# Patient Record
Sex: Male | Born: 1950 | Race: White | Hispanic: No | Marital: Married | Smoking: Current every day smoker
Health system: Southern US, Community
[De-identification: ages and names within clinical notes are randomized; demographics above are authoritative.]

## PROBLEM LIST (undated history)

## (undated) DIAGNOSIS — K635 Polyp of colon: Secondary | ICD-10-CM

## (undated) DIAGNOSIS — E1141 Type 2 diabetes mellitus with diabetic mononeuropathy: Secondary | ICD-10-CM

## (undated) DIAGNOSIS — M1712 Unilateral primary osteoarthritis, left knee: Secondary | ICD-10-CM

## (undated) DIAGNOSIS — Z720 Tobacco use: Secondary | ICD-10-CM

## (undated) DIAGNOSIS — E785 Hyperlipidemia, unspecified: Secondary | ICD-10-CM

## (undated) DIAGNOSIS — I10 Essential (primary) hypertension: Secondary | ICD-10-CM

## (undated) DIAGNOSIS — J449 Chronic obstructive pulmonary disease, unspecified: Secondary | ICD-10-CM

## (undated) DIAGNOSIS — I2510 Atherosclerotic heart disease of native coronary artery without angina pectoris: Secondary | ICD-10-CM

## (undated) DIAGNOSIS — E119 Type 2 diabetes mellitus without complications: Secondary | ICD-10-CM

## (undated) DIAGNOSIS — M1990 Unspecified osteoarthritis, unspecified site: Secondary | ICD-10-CM

## (undated) DIAGNOSIS — K219 Gastro-esophageal reflux disease without esophagitis: Secondary | ICD-10-CM

## (undated) HISTORY — PX: TONSILLECTOMY: SUR1361

## (undated) HISTORY — PX: CATARACT EXTRACTION W/ INTRAOCULAR LENS  IMPLANT, BILATERAL: SHX1307

## (undated) HISTORY — PX: CORONARY ANGIOPLASTY WITH STENT PLACEMENT: SHX49

---

## 2005-10-23 ENCOUNTER — Ambulatory Visit: Payer: Self-pay | Admitting: Gastroenterology

## 2008-08-14 ENCOUNTER — Ambulatory Visit: Payer: Self-pay | Admitting: Family Medicine

## 2008-12-12 ENCOUNTER — Ambulatory Visit: Payer: Self-pay | Admitting: Unknown Physician Specialty

## 2009-06-04 ENCOUNTER — Ambulatory Visit: Payer: Self-pay | Admitting: Family Medicine

## 2010-03-04 ENCOUNTER — Ambulatory Visit: Payer: Self-pay | Admitting: Family Medicine

## 2010-03-20 ENCOUNTER — Ambulatory Visit: Payer: Self-pay | Admitting: Rheumatology

## 2010-05-08 ENCOUNTER — Ambulatory Visit: Payer: Self-pay | Admitting: Internal Medicine

## 2010-05-12 ENCOUNTER — Ambulatory Visit: Payer: Self-pay | Admitting: Anesthesiology

## 2010-06-10 ENCOUNTER — Ambulatory Visit: Payer: Self-pay | Admitting: Anesthesiology

## 2010-07-10 ENCOUNTER — Ambulatory Visit: Payer: Self-pay | Admitting: Anesthesiology

## 2010-08-12 ENCOUNTER — Ambulatory Visit: Payer: Self-pay | Admitting: Anesthesiology

## 2011-04-29 ENCOUNTER — Ambulatory Visit: Payer: Self-pay | Admitting: Family Medicine

## 2011-04-29 ENCOUNTER — Observation Stay: Payer: Self-pay | Admitting: Specialist

## 2012-01-28 ENCOUNTER — Ambulatory Visit: Payer: Self-pay | Admitting: Family Medicine

## 2014-11-20 ENCOUNTER — Inpatient Hospital Stay: Admission: EM | Admit: 2014-11-20 | Payer: Self-pay | Source: Other Acute Inpatient Hospital | Admitting: Cardiology

## 2014-11-20 ENCOUNTER — Encounter (HOSPITAL_COMMUNITY)
Admission: EM | Disposition: A | Discharge status: Home / Self Care | Payer: Self-pay | Source: Other Acute Inpatient Hospital | Attending: Cardiology

## 2014-11-20 ENCOUNTER — Emergency Department: Payer: Self-pay | Admitting: Internal Medicine

## 2014-11-20 ENCOUNTER — Inpatient Hospital Stay (HOSPITAL_COMMUNITY)
Admission: EM | Admit: 2014-11-20 | Discharge: 2014-11-22 | DRG: 282 | Disposition: A | Discharge status: Home / Self Care | Payer: Medicare Other | Source: Other Acute Inpatient Hospital | Attending: Cardiology | Admitting: Cardiology

## 2014-11-20 ENCOUNTER — Encounter (HOSPITAL_COMMUNITY): Payer: Self-pay | Admitting: Nurse Practitioner

## 2014-11-20 DIAGNOSIS — J449 Chronic obstructive pulmonary disease, unspecified: Secondary | ICD-10-CM | POA: Diagnosis present

## 2014-11-20 DIAGNOSIS — F17210 Nicotine dependence, cigarettes, uncomplicated: Secondary | ICD-10-CM | POA: Diagnosis present

## 2014-11-20 DIAGNOSIS — E1141 Type 2 diabetes mellitus with diabetic mononeuropathy: Secondary | ICD-10-CM | POA: Diagnosis present

## 2014-11-20 DIAGNOSIS — I2129 ST elevation (STEMI) myocardial infarction involving other sites: Secondary | ICD-10-CM | POA: Insufficient documentation

## 2014-11-20 DIAGNOSIS — I10 Essential (primary) hypertension: Secondary | ICD-10-CM | POA: Diagnosis present

## 2014-11-20 DIAGNOSIS — I214 Non-ST elevation (NSTEMI) myocardial infarction: Secondary | ICD-10-CM | POA: Diagnosis present

## 2014-11-20 DIAGNOSIS — E785 Hyperlipidemia, unspecified: Secondary | ICD-10-CM | POA: Diagnosis present

## 2014-11-20 DIAGNOSIS — Z7982 Long term (current) use of aspirin: Secondary | ICD-10-CM | POA: Diagnosis not present

## 2014-11-20 DIAGNOSIS — I2584 Coronary atherosclerosis due to calcified coronary lesion: Secondary | ICD-10-CM | POA: Diagnosis present

## 2014-11-20 DIAGNOSIS — E119 Type 2 diabetes mellitus without complications: Secondary | ICD-10-CM

## 2014-11-20 DIAGNOSIS — I2510 Atherosclerotic heart disease of native coronary artery without angina pectoris: Secondary | ICD-10-CM

## 2014-11-20 DIAGNOSIS — R079 Chest pain, unspecified: Secondary | ICD-10-CM | POA: Diagnosis present

## 2014-11-20 DIAGNOSIS — I213 ST elevation (STEMI) myocardial infarction of unspecified site: Secondary | ICD-10-CM | POA: Insufficient documentation

## 2014-11-20 DIAGNOSIS — Z955 Presence of coronary angioplasty implant and graft: Secondary | ICD-10-CM | POA: Diagnosis not present

## 2014-11-20 DIAGNOSIS — I2109 ST elevation (STEMI) myocardial infarction involving other coronary artery of anterior wall: Secondary | ICD-10-CM

## 2014-11-20 DIAGNOSIS — Z720 Tobacco use: Secondary | ICD-10-CM | POA: Diagnosis present

## 2014-11-20 HISTORY — DX: Unspecified osteoarthritis, unspecified site: M19.90

## 2014-11-20 HISTORY — DX: Hyperlipidemia, unspecified: E78.5

## 2014-11-20 HISTORY — DX: Atherosclerotic heart disease of native coronary artery without angina pectoris: I25.10

## 2014-11-20 HISTORY — DX: Polyp of colon: K63.5

## 2014-11-20 HISTORY — DX: Unilateral primary osteoarthritis, left knee: M17.12

## 2014-11-20 HISTORY — DX: Essential (primary) hypertension: I10

## 2014-11-20 HISTORY — PX: CARDIAC CATHETERIZATION: SHX172

## 2014-11-20 HISTORY — DX: Tobacco use: Z72.0

## 2014-11-20 HISTORY — DX: Gastro-esophageal reflux disease without esophagitis: K21.9

## 2014-11-20 HISTORY — DX: Type 2 diabetes mellitus without complications: E11.9

## 2014-11-20 HISTORY — PX: LEFT HEART CATHETERIZATION WITH CORONARY ANGIOGRAM: SHX5451

## 2014-11-20 HISTORY — DX: Chronic obstructive pulmonary disease, unspecified: J44.9

## 2014-11-20 HISTORY — DX: Type 2 diabetes mellitus with diabetic mononeuropathy: E11.41

## 2014-11-20 LAB — BASIC METABOLIC PANEL
Anion Gap: 6 — ABNORMAL LOW (ref 7–16)
BUN: 8 mg/dL (ref 7–18)
CALCIUM: 8.6 mg/dL (ref 8.5–10.1)
CO2: 30 mmol/L (ref 21–32)
Chloride: 104 mmol/L (ref 98–107)
Creatinine: 0.91 mg/dL (ref 0.60–1.30)
GLUCOSE: 117 mg/dL — AB (ref 65–99)
Osmolality: 279 (ref 275–301)
POTASSIUM: 3.8 mmol/L (ref 3.5–5.1)
Sodium: 140 mmol/L (ref 136–145)

## 2014-11-20 LAB — COMPREHENSIVE METABOLIC PANEL
ALK PHOS: 59 U/L (ref 39–117)
ALT: 23 U/L (ref 0–53)
AST: 30 U/L (ref 0–37)
Albumin: 3.5 g/dL (ref 3.5–5.2)
Anion gap: 11 (ref 5–15)
BUN: 8 mg/dL (ref 6–23)
CHLORIDE: 103 meq/L (ref 96–112)
CO2: 23 mmol/L (ref 19–32)
CREATININE: 1.07 mg/dL (ref 0.50–1.35)
Calcium: 9.1 mg/dL (ref 8.4–10.5)
GFR calc Af Amer: 82 mL/min — ABNORMAL LOW (ref >90)
GFR, EST NON AFRICAN AMERICAN: 71 mL/min — AB (ref >90)
Glucose, Bld: 220 mg/dL — ABNORMAL HIGH (ref 70–99)
Potassium: 3.7 mmol/L (ref 3.5–5.1)
SODIUM: 137 mmol/L (ref 135–145)
TOTAL PROTEIN: 6.9 g/dL (ref 6.0–8.3)
Total Bilirubin: 0.8 mg/dL (ref 0.3–1.2)

## 2014-11-20 LAB — CBC WITH DIFFERENTIAL/PLATELET
BASOS PCT: 0 % (ref 0–1)
Basophils Absolute: 0 10*3/uL (ref 0.0–0.1)
EOS ABS: 0.2 10*3/uL (ref 0.0–0.7)
Eosinophils Relative: 4 % (ref 0–5)
HEMATOCRIT: 37.3 % — AB (ref 39.0–52.0)
HEMOGLOBIN: 12.4 g/dL — AB (ref 13.0–17.0)
LYMPHS ABS: 1.4 10*3/uL (ref 0.7–4.0)
Lymphocytes Relative: 28 % (ref 12–46)
MCH: 28.8 pg (ref 26.0–34.0)
MCHC: 33.2 g/dL (ref 30.0–36.0)
MCV: 86.5 fL (ref 78.0–100.0)
Monocytes Absolute: 0.3 10*3/uL (ref 0.1–1.0)
Monocytes Relative: 5 % (ref 3–12)
Neutro Abs: 3.1 10*3/uL (ref 1.7–7.7)
Neutrophils Relative %: 62 % (ref 43–77)
PLATELETS: 108 10*3/uL — AB (ref 150–400)
RBC: 4.31 MIL/uL (ref 4.22–5.81)
RDW: 14.1 % (ref 11.5–15.5)
WBC: 5 10*3/uL (ref 4.0–10.5)

## 2014-11-20 LAB — CBC
HCT: 40.3 % (ref 40.0–52.0)
HGB: 13.1 g/dL (ref 13.0–18.0)
MCH: 29.1 pg (ref 26.0–34.0)
MCHC: 32.5 g/dL (ref 32.0–36.0)
MCV: 90 fL (ref 80–100)
PLATELETS: 125 10*3/uL — AB (ref 150–440)
RBC: 4.5 10*6/uL (ref 4.40–5.90)
RDW: 14.7 % — AB (ref 11.5–14.5)
WBC: 5.2 10*3/uL (ref 3.8–10.6)

## 2014-11-20 LAB — TROPONIN I
TROPONIN I: 0.12 ng/mL — AB (ref <0.031)
Troponin-I: <0.02 ng/mL

## 2014-11-20 LAB — MAGNESIUM: MAGNESIUM: 1.6 mg/dL (ref 1.5–2.5)

## 2014-11-20 LAB — TSH: TSH: 1.647 u[IU]/mL (ref 0.350–4.500)

## 2014-11-20 LAB — GLUCOSE, CAPILLARY: Glucose-Capillary: 138 mg/dL — ABNORMAL HIGH (ref 70–99)

## 2014-11-20 SURGERY — LEFT HEART CATHETERIZATION WITH CORONARY ANGIOGRAM

## 2014-11-20 MED ORDER — CYCLOBENZAPRINE HCL 10 MG PO TABS
10.0000 mg | ORAL_TABLET | Freq: Every day | ORAL | Status: DC
  Filled 2014-11-20 (×3): qty 1

## 2014-11-20 MED ORDER — HEPARIN SODIUM (PORCINE) 5000 UNIT/ML IJ SOLN: 5000.0000 [IU] | Freq: Three times a day (TID) | INTRAMUSCULAR | Status: DC

## 2014-11-20 MED ORDER — SODIUM CHLORIDE 0.9 % IJ SOLN: 3.0000 mL | INTRAMUSCULAR | Status: DC | PRN

## 2014-11-20 MED ORDER — SODIUM CHLORIDE 0.9 % IJ SOLN
3.0000 mL | Freq: Two times a day (BID) | INTRAMUSCULAR | Status: DC
  Administered 2014-11-21: 10:00:00 3 mL via INTRAVENOUS

## 2014-11-20 MED ORDER — INSULIN ASPART 100 UNIT/ML ~~LOC~~ SOLN
0.0000 [IU] | Freq: Three times a day (TID) | SUBCUTANEOUS | Status: DC
  Administered 2014-11-21: 13:00:00 3 [IU] via SUBCUTANEOUS
  Administered 2014-11-21 – 2014-11-22 (×3): 2 [IU] via SUBCUTANEOUS

## 2014-11-20 MED ORDER — ALBUTEROL SULFATE (2.5 MG/3ML) 0.083% IN NEBU: 2.5000 mg | INHALATION_SOLUTION | RESPIRATORY_TRACT | Status: DC | PRN

## 2014-11-20 MED ORDER — CARVEDILOL 12.5 MG PO TABS
12.5000 mg | ORAL_TABLET | Freq: Two times a day (BID) | ORAL | Status: DC
Start: 2014-11-20 — End: 2014-11-21
  Administered 2014-11-20: 12.5 mg via ORAL
  Filled 2014-11-20 (×3): qty 1

## 2014-11-20 MED ORDER — VERAPAMIL HCL 2.5 MG/ML IV SOLN
INTRAVENOUS | Status: AC
  Filled 2014-11-20: qty 2

## 2014-11-20 MED ORDER — SODIUM CHLORIDE 0.9 % IV SOLN: 250.0000 mL | INTRAVENOUS | Status: DC | PRN

## 2014-11-20 MED ORDER — LINAGLIPTIN 5 MG PO TABS
5.0000 mg | ORAL_TABLET | Freq: Every day | ORAL | Status: DC
  Administered 2014-11-21 – 2014-11-22 (×2): 5 mg via ORAL
  Filled 2014-11-20 (×2): qty 1

## 2014-11-20 MED ORDER — ONDANSETRON HCL 4 MG/2ML IJ SOLN: 4.0000 mg | Freq: Four times a day (QID) | INTRAMUSCULAR | Status: DC | PRN

## 2014-11-20 MED ORDER — HEPARIN SODIUM (PORCINE) 5000 UNIT/ML IJ SOLN
5000.0000 [IU] | Freq: Three times a day (TID) | INTRAMUSCULAR | Status: DC
  Administered 2014-11-21 – 2014-11-22 (×3): 5000 [IU] via SUBCUTANEOUS
  Filled 2014-11-20 (×7): qty 1

## 2014-11-20 MED ORDER — HYDRALAZINE HCL 20 MG/ML IJ SOLN: 10.0000 mg | INTRAMUSCULAR | Status: DC | PRN

## 2014-11-20 MED ORDER — ACETAMINOPHEN 325 MG PO TABS: 650.0000 mg | ORAL_TABLET | ORAL | Status: DC | PRN

## 2014-11-20 MED ORDER — ASPIRIN 81 MG PO CHEW
81.0000 mg | CHEWABLE_TABLET | Freq: Every day | ORAL | Status: DC
  Administered 2014-11-21 – 2014-11-22 (×2): 81 mg via ORAL
  Filled 2014-11-20 (×3): qty 1

## 2014-11-20 MED ORDER — NITROGLYCERIN 0.4 MG SL SUBL: 0.4000 mg | SUBLINGUAL_TABLET | SUBLINGUAL | Status: DC | PRN

## 2014-11-20 MED ORDER — LIDOCAINE HCL (PF) 1 % IJ SOLN
INTRAMUSCULAR | Status: AC
  Filled 2014-11-20: qty 30

## 2014-11-20 MED ORDER — PRAVASTATIN SODIUM 40 MG PO TABS
40.0000 mg | ORAL_TABLET | Freq: Every day | ORAL | Status: DC
  Administered 2014-11-21: 18:00:00 40 mg via ORAL
  Filled 2014-11-20 (×2): qty 1

## 2014-11-20 MED ORDER — TIOTROPIUM BROMIDE MONOHYDRATE 18 MCG IN CAPS
18.0000 ug | ORAL_CAPSULE | Freq: Every day | RESPIRATORY_TRACT | Status: DC
  Administered 2014-11-21 – 2014-11-22 (×2): 18 ug via RESPIRATORY_TRACT
  Filled 2014-11-20 (×2): qty 5

## 2014-11-20 MED ORDER — CARVEDILOL 12.5 MG PO TABS: 12.5000 mg | ORAL_TABLET | Freq: Two times a day (BID) | ORAL | Status: DC

## 2014-11-20 MED ORDER — PANTOPRAZOLE SODIUM 40 MG PO TBEC
40.0000 mg | DELAYED_RELEASE_TABLET | Freq: Every day | ORAL | Status: DC
  Administered 2014-11-21 – 2014-11-22 (×2): 40 mg via ORAL
  Filled 2014-11-20 (×2): qty 1

## 2014-11-20 MED ORDER — SODIUM CHLORIDE 0.9 % IV SOLN
INTRAVENOUS | Status: DC
Start: 2014-11-20 — End: 2014-11-21

## 2014-11-20 MED ORDER — ISOSORBIDE MONONITRATE ER 30 MG PO TB24
30.0000 mg | ORAL_TABLET | Freq: Every day | ORAL | Status: DC
  Administered 2014-11-21: 30 mg via ORAL
  Filled 2014-11-20 (×2): qty 1

## 2014-11-20 MED ORDER — NITROGLYCERIN 1 MG/10 ML FOR IR/CATH LAB
INTRA_ARTERIAL | Status: AC
  Filled 2014-11-20: qty 10

## 2014-11-20 MED ORDER — HEPARIN (PORCINE) IN NACL 2-0.9 UNIT/ML-% IJ SOLN
INTRAMUSCULAR | Status: AC
  Filled 2014-11-20: qty 1000

## 2014-11-20 MED ORDER — AMLODIPINE BESYLATE 10 MG PO TABS
10.0000 mg | ORAL_TABLET | Freq: Every day | ORAL | Status: DC
  Administered 2014-11-21 – 2014-11-22 (×2): 10 mg via ORAL
  Filled 2014-11-20 (×2): qty 1

## 2014-11-20 MED ORDER — ASPIRIN EC 81 MG PO TBEC: 81.0000 mg | DELAYED_RELEASE_TABLET | Freq: Every day | ORAL | Status: DC

## 2014-11-20 MED ORDER — LOSARTAN POTASSIUM 50 MG PO TABS
100.0000 mg | ORAL_TABLET | Freq: Every day | ORAL | Status: DC
  Administered 2014-11-21 – 2014-11-22 (×2): 100 mg via ORAL
  Filled 2014-11-20 (×2): qty 2

## 2014-11-20 NOTE — H&P (Signed)
Patient ID: Tristan Shepherd MRN: [id number], DOB/AGE: [birth year]   Admit date: 11/20/2014   Primary Physician: Maudie Flakesale Feldpausch, MD [facility]) Primary Cardiologist: A. Parachos [facility])  Pt. Profile:  64 y/o male with a h/o CAD s/p prior PCI's @ [facility], who presents on tx today from [facility] 2/2 anterior STEMI.  Problem List  Past Medical History  Diagnosis Date  . CAD (coronary artery disease)     a. 1993 PTCA LCX;  b. 1999 PCI/BMS to RCA and LAD.  Tristan Shepherd. Type II diabetes mellitus   . Essential hypertension   . Hyperlipidemia   . Tobacco abuse   . COPD (chronic obstructive pulmonary disease)   . Colon polyps   . Diabetic mononeuropathy     Past Surgical History  Procedure Laterality Date  . Cataract extraction    . Tonsillectomy       Allergies  Allergies no known allergies  HPI  64 year old male with a prior history of coronary artery disease status post previous PTCA of the left circumflex in 1993 with subsequent bare metal stenting of the LAD and right coronary artery in 1999. He is followed by [place] cardiologist in [place] and believes he has had a catheterization since his last intervention 1999. He was in his usual state of health until this afternoon, when he developed left-sided chest discomfort with radiation to the left forearm at approximately 5:45 PM.  He presented to [facility] in the emergency department was noted to have approximately 2 mm of anterior ST segment elevation in lead V3 with approximately 1 mm of ST elevation in V4. Subsequent ECG showed less pronounced, persistent ST elevation. A code STEMI was called and he was treated with heparin and nitroglycerin. He was transferred to [facility] cardiac catheterization laboratory for further evaluation.  Home Medications (obtained from family medicine note on care everywhere)  Spiriva 18 g once daily Imdur 39 g daily Norvasc 10 mg daily Nitroglycerin  glycerin when necessary Vitamin D3 1000 units daily Flexeril 10 mL daily at bedtime Aspirin 81 mg daily Januvia 50 mg daily Prevacid 30 mg daily Cozaar 25 mA daily Coreg 12.5 g twice a day Metformin 850 mg twice a day Albuterol inhaler 2 puffs every 6 hours when necessary Family History  Unable to obtain at this time secondary to acuity.  Social History  History   Social History  . Marital Status: Married    Spouse Name: N/A    Number of Children: N/A  . Years of Education: N/A   Occupational History  . Not on file.   Social History Main Topics  . Smoking status: Current Every Day Smoker -- 1.50 packs/day for 40 years    Types: Cigarettes  . Smokeless tobacco: Not on file  . Alcohol Use: Not on file  . Drug Use: Not on file  . Sexual Activity: Not on file   Other Topics Concern  . Not on file   Social History Narrative   Lives in [place].     Review of Systems General:  No chills, fever, night sweats or weight changes.  Cardiovascular: Positive chest pain,  no dyspnea on exertion, edema, orthopnea, palpitations, paroxysmal nocturnal dyspnea. Dermatological: No rash, lesions/masses Respiratory: No cough, dyspnea Urologic: No hematuria, dysuria Abdominal:   No nausea, vomiting, diarrhea, bright red blood per rectum, melena, or hematemesis Neurologic:  No visual changes, wkns, changes in mental status. All other systems reviewed and are otherwise negative except as noted  above.  Physical Exam   temperature 98.1, heart rate 86, respirations 11, blood pressure 165/92  General: Pleasant, NAD Psych: Normal affect. Neuro: Alert and oriented X 3. Moves all extremities spontaneously. HEENT: Normal  Neck: Supple without bruits or JVD. Lungs:  Resp regular and unlabored, CTA. Heart: RRR no s3, s4, or murmurs. Abdomen: Soft, non-tender, non-distended, BS + x 4.  Extremities: No clubbing, cyanosis or edema. DP/PT/Radials 2+ and equal bilaterally.  Labs  Pending     Radiology/Studies  No results found.  ECG  regular sinus rhythm, 75, 2 mm ST segment elevation in the 3 with 1 mm elevation in V4.  ASESSMENT AND PLAN   1. Acute anterior ST segment elevation myocardial infarction: Patient presented with chest pain and finding of anterior ST segment elevation in leads V3 and V4. Diagnostic catheterization is ongoing. Plan to resume aspirin  and beta blocker therapy. Not clear why is on statin at home.   2. Hypertension: Blood pressure currently elevated. Follow on home meds and adjust as listed.  3. Hyperlipidemia: He does not appear to be on a statin at home. We will look into this.  4. DM II: Hold metformin. Has slight scale insulin.  5. Tobacco abuse: Cessation counseling required.  Signed, Tristan Ducking, NP 11/20/2014, 6:53 PM   Patient seen and examined and history reviewed. Agree with above findings and plan. 64 yo WM with known history of CAD presents with acute chest pain. Ecg suggests an anterior STEMI with ST elevation predominantly in V3 and V4. Recommend emergent cath with possible PCI. Will need aggressive risk factor modification.   Tristan Shepherd, MDFACC 11/20/2014 7:26 PM

## 2014-11-20 NOTE — CV Procedure (Signed)
    Cardiac Catheterization Procedure Note  Name: Tristan Shepherd MRN: [id number] DOB: 11-27-1950  Procedure: Left Heart Cath, Selective Coronary Angiography, LV angiography  Indication: 64 yo WM with history of CAD. He is s/p PTCA of the LCx in 1993 and stenting of the RCA and LAD in 1999. He present now in transfer [facility] with acute onset of chest pain. Ecg shows ST elevation of 2 mm in leads V3 and V4. These changes were new compared to 2012.   Procedural Details: The right wrist was prepped, draped, and anesthetized with 1% lidocaine. Using the modified Seldinger technique, a 6 French slender sheath was introduced into the right radial artery. 3 mg of verapamil was administered through the sheath, weight-based unfractionated heparin was administered intravenously. Standard Judkins catheters were used for selective coronary angiography and left ventriculography. Catheter exchanges were performed over an exchange length guidewire. There were no immediate procedural complications. A TR band was used for radial hemostasis at the completion of the procedure.  The patient was transferred to the post catheterization recovery area for further monitoring. Contrast 100 cc.   Procedural Findings: Hemodynamics: AO 166/62 mean 105 mm Hg LV 166/11 mm Hg  Coronary angiography: Coronary dominance: right  Left mainstem: Calcified without obstructive disease.  Left anterior descending (LAD): The LAD is calcified proximally. There appears to be a stent in the mid vessel that is widely patent. There is scattered disease in the LAD less than 20-30%. The first diagonal has a high take off and has a 50% stenosis in the mid vessel. The second diagonal has 30% disease  Left circumflex (LCx): The LCx is heavily calcified throughout. There is no obstructive disease.   Right coronary artery (RCA):  The RCA has a long stent in the proximal vessel that is widely patent. In the mid vessel there is  a focal 30-40% lesion. The distal RCA has scattered disease less than 20%. The PDA has calcific plaque of 30-40%. The first RV marginal branch is occluded in the proximal to mid vessel.   Left ventriculography: Left ventricular systolic function is normal, LVEF is estimated at 55-65%, there is no significant mitral regurgitation   Final Conclusions:   1. Single vessel obstructive CAD. The only significant obstruction is in the RV marginal branch.  2. Normal LV function.  Recommendations: The patient is currently pain free. It is possible that the RV marginal branch occlusion could be his acute event but is a small vessel. I would not recommend PCI. Will treat medically and cycle cardiac enzymes and Ecg.. Check Echo in am. Overall anatomy looks favorable.   [name], MDFACC  11/20/2014, 7:14 PM

## 2014-11-21 ENCOUNTER — Encounter (HOSPITAL_COMMUNITY): Payer: Self-pay | Admitting: Cardiology

## 2014-11-21 DIAGNOSIS — I214 Non-ST elevation (NSTEMI) myocardial infarction: Secondary | ICD-10-CM

## 2014-11-21 DIAGNOSIS — I2109 ST elevation (STEMI) myocardial infarction involving other coronary artery of anterior wall: Secondary | ICD-10-CM

## 2014-11-21 DIAGNOSIS — E119 Type 2 diabetes mellitus without complications: Secondary | ICD-10-CM

## 2014-11-21 DIAGNOSIS — E785 Hyperlipidemia, unspecified: Secondary | ICD-10-CM

## 2014-11-21 DIAGNOSIS — Z720 Tobacco use: Secondary | ICD-10-CM

## 2014-11-21 LAB — BASIC METABOLIC PANEL
Anion gap: 12 (ref 5–15)
BUN: 7 mg/dL (ref 6–23)
CALCIUM: 9.2 mg/dL (ref 8.4–10.5)
CHLORIDE: 98 meq/L (ref 96–112)
CO2: 27 mmol/L (ref 19–32)
Creatinine, Ser: 0.9 mg/dL (ref 0.50–1.35)
GFR calc Af Amer: >90 mL/min (ref >90)
GFR calc non Af Amer: 87 mL/min — ABNORMAL LOW (ref >90)
GLUCOSE: 275 mg/dL — AB (ref 70–99)
Potassium: 4.5 mmol/L (ref 3.5–5.1)
SODIUM: 137 mmol/L (ref 135–145)

## 2014-11-21 LAB — GLUCOSE, CAPILLARY
GLUCOSE-CAPILLARY: 125 mg/dL — AB (ref 70–99)
Glucose-Capillary: 168 mg/dL — ABNORMAL HIGH (ref 70–99)
Glucose-Capillary: 137 mg/dL — ABNORMAL HIGH (ref 70–99)
Glucose-Capillary: 176 mg/dL — ABNORMAL HIGH (ref 70–99)

## 2014-11-21 LAB — LIPID PANEL
Cholesterol: 155 mg/dL (ref 0–200)
HDL: 31 mg/dL — AB (ref >39)
LDL Cholesterol: 88 mg/dL (ref 0–99)
Total CHOL/HDL Ratio: 5 RATIO
Triglycerides: 181 mg/dL — ABNORMAL HIGH (ref <150)
VLDL: 36 mg/dL (ref 0–40)

## 2014-11-21 LAB — TROPONIN I
Troponin I: 1.07 ng/mL (ref <0.031)
Troponin I: 3.58 ng/mL (ref <0.031)
Troponin I: 5.01 ng/mL (ref <0.031)

## 2014-11-21 MED ORDER — CARVEDILOL 25 MG PO TABS
25.0000 mg | ORAL_TABLET | Freq: Two times a day (BID) | ORAL | Status: DC
  Administered 2014-11-21: 25 mg via ORAL
  Administered 2014-11-21 (×2): 12.5 mg via ORAL
  Administered 2014-11-22: 25 mg via ORAL
  Filled 2014-11-21 (×3): qty 1
  Filled 2014-11-21: qty 2
  Filled 2014-11-21 (×2): qty 1

## 2014-11-21 NOTE — Progress Notes (Addendum)
Patient Name: Tristan Shepherd Date of Encounter: 11/21/2014     Principal Problem:   ST elevation myocardial infarction (STEMI) involving other coronary artery Active Problems:   DM type 2 (diabetes mellitus, type 2)   HTN (hypertension)   Hyperlipidemia   COPD (chronic obstructive pulmonary disease)   Tobacco abuse   STEMI (ST elevation myocardial infarction)    SUBJECTIVE  No CP or SOB. Feeling well.   CURRENT MEDS . amLODipine  10 mg Oral Daily  . aspirin  81 mg Oral Daily  . carvedilol  12.5 mg Oral BID WC  . cyclobenzaprine  10 mg Oral QHS  . heparin  5,000 Units Subcutaneous 3 times per day  . insulin aspart  0-15 Units Subcutaneous TID WC  . isosorbide mononitrate  30 mg Oral Daily  . linagliptin  5 mg Oral Daily  . losartan  100 mg Oral Daily  . pantoprazole  40 mg Oral Daily  . pravastatin  40 mg Oral q1800  . sodium chloride  3 mL Intravenous Q12H  . tiotropium  18 mcg Inhalation Daily    OBJECTIVE  Filed Vitals:   11/20/14 2100 11/20/14 2347 11/21/14 0300 11/21/14 0337  BP: 169/63 182/77 166/69 161/90  Pulse: 82 84 88 83  Temp:  98.5 F (36.9 C)  98.3 F (36.8 C)  TempSrc:  Oral  Oral  Resp:  16    Weight:    154 lb 5.2 oz (70 kg)  SpO2: 96% 96% 90% 95%    Intake/Output Summary (Last 24 hours) at 11/21/14 0704 Last data filed at 11/21/14 0600  Gross per 24 hour  Intake    600 ml  Output   2000 ml  Net  -1400 ml   Filed Weights   11/21/14 0337  Weight: 154 lb 5.2 oz (70 kg)    PHYSICAL EXAM  General: Pleasant, NAD. Neuro: Alert and oriented X 3. Moves all extremities spontaneously. Psych: Normal affect. HEENT:  Normal  Neck: Supple without bruits or JVD. Lungs:  Diffuse wheezing  Heart: RRR no s3, s4, or murmurs. Abdomen: Soft, non-tender, non-distended, BS + x 4.  Extremities: No clubbing, cyanosis or edema. DP/PT/Radials 2+ and equal bilaterally.  Accessory Clinical Findings  CBC  Recent Labs  11/20/14 2112  WBC 5.0    NEUTROABS 3.1  HGB 12.4*  HCT 37.3*  MCV 86.5  PLT 108*   Basic Metabolic Panel  Recent Labs  11/20/14 2112  NA 137  K 3.7  CL 103  CO2 23  GLUCOSE 220*  BUN 8  CREATININE 1.07  CALCIUM 9.1  MG 1.6   Liver Function Tests  Recent Labs  11/20/14 2112  AST 30  ALT 23  ALKPHOS 59  BILITOT 0.8  PROT 6.9  ALBUMIN 3.5   Cardiac Enzymes  Recent Labs  11/20/14 2112 11/21/14 0126  TROPONINI 0.12* 1.07*   Fasting Lipid Panel  Recent Labs  11/21/14 0126  CHOL 155  HDL 31*  LDLCALC 88  TRIG 161*  CHOLHDL 5.0   Thyroid Function Tests  Recent Labs  11/20/14 2112  TSH 1.647    TELE NSR with some PVCs  Radiology/Studies  No results found.  ASSESSMENT AND PLAN  64 yo WM with history of CAD s/p PTCA of the LCx in 1993 and stenting of the RCA and LAD in 1999, DM, HTN, HLD, tobacco abuse, and COPD who presented in transfer from [facility] hospital yesterday with acute onset of chest pain. Ecg revealed ST  elevation of 2 mm in leads V3 and V4. These changes were new compared to 2012 and he was taken for emergent coronary angiography.   NSTEMI: Patient presented with chest pain and finding of anterior ST segment elevation in leads V3 and V4.  -- s/p emergent LHC which revealed    1. Single vessel obstructive CAD. The only significant obstruction is in the RV marginal branch.    2. Normal LV function. -- Recommendations: The patient is currently pain free. It is possible that the RV marginal branch occlusion could be his acute event but is a small vessel. Dr. SwazilandJordan did not recommend PCI. Planned to treat medically. Will add a statin, continue BB, ASA and imdur -- Troponin 0.12--> 1.07. Continue to follow enzymes.  -- 2D ECHO today  Hypertension: Blood pressure currently elevated. He is currently on amlodipine 10mg , coreg 12.5mg  BID, and losartan 100mg  . HR in 80s, will increase his coreg to 25mg  BID  Hyperlipidemia:  Started on a statin   DM II: Hold  metformin 48 hours after contrast dye exposure. Cont SSI  Tobacco abuse: Cessation counseling provided. He is willing to "cut back".    Tristan FischerSigned, THOMPSON, KATHRYN R PA-C  Pager [phone]  I have personally seen and examined this patient with Carlean JewsKatie Thompson, PA-C. I agree with the assessment and plan as outlined above. Admitted with NSTEMI. Cardiac cath per Dr. SwazilandJordan but major vessels are patent. Small RV marginal branch is occluded. NO PCI performed. Medical management of CAD. Echo today. Ambulate. Probable discharge in am 11/22/14.   Tristan Shepherd 11/21/2014 2:47 PM

## 2014-11-21 NOTE — Progress Notes (Signed)
UR completed Domitila Stetler K. Kile Kabler, RN, BSN, MSHL, CCM  11/21/2014 12:42 PM

## 2014-11-21 NOTE — Progress Notes (Signed)
  Echocardiogram 2D Echocardiogram has been performed.  Pearce Littlefield FRANCES 11/21/2014, 11:29 AM

## 2014-11-21 NOTE — Progress Notes (Signed)
TR BAND REMOVAL  LOCATION:  right radial  DEFLATED PER PROTOCOL:  Yes.    TIME BAND OFF / DRESSING APPLIED:   2145   SITE UPON ARRIVAL:   Level 0  SITE AFTER BAND REMOVAL:  Level 0  REVERSE ALLEN'S TEST:    positive  CIRCULATION SENSATION AND MOVEMENT:  Within Normal Limits  Yes.    COMMENTS:    

## 2014-11-21 NOTE — Progress Notes (Signed)
CARDIAC REHAB PHASE I   PRE:  Rate/Rhythm: 80 SR    BP: sitting 184/90    SaO2:   MODE:  Ambulation: 460 ft   POST:  Rate/Rhythm: 95 SR    BP: sitting 178/88     SaO2:   Tolerated well without CP. Feels good and wants to go home. BP elevated (took manually). Discussed MI and restrictions with pt. Pt sts he wants to live his life happy and with quality, he is not concerned with quantity. Discussed the dangers of smoking and pt sts he would like to cut back but is not interested in quitting. Gave resources. Also gave diet sheets but pt did not want to discuss diet changes. Pt sts he is active and does not want to incorporate more formal ex in his lifestyle. Pt was receptive to NTG (keeping fresh). Gave brochure for CRPII but pt not interested at this time.  Encouraged pt to walk today which he is happy to do.  [phone]  Elissa LovettReeve, Elycia Woodside ScribnerKristan CES, ACSM 11/21/2014 9:13 AM

## 2014-11-22 ENCOUNTER — Encounter (HOSPITAL_COMMUNITY): Payer: Self-pay | Admitting: Nurse Practitioner

## 2014-11-22 DIAGNOSIS — I2510 Atherosclerotic heart disease of native coronary artery without angina pectoris: Secondary | ICD-10-CM

## 2014-11-22 LAB — GLUCOSE, CAPILLARY: GLUCOSE-CAPILLARY: 149 mg/dL — AB (ref 70–99)

## 2014-11-22 MED ORDER — ISOSORBIDE MONONITRATE ER 60 MG PO TB24: 60.0000 mg | ORAL_TABLET | Freq: Every day | ORAL | Status: DC

## 2014-11-22 MED ORDER — ISOSORBIDE MONONITRATE ER 60 MG PO TB24
60.0000 mg | ORAL_TABLET | Freq: Every day | ORAL | Status: DC
  Administered 2014-11-22: 12:00:00 60 mg via ORAL
  Filled 2014-11-22 (×2): qty 1

## 2014-11-22 MED ORDER — PRAVASTATIN SODIUM 40 MG PO TABS: 40.0000 mg | ORAL_TABLET | Freq: Every day | ORAL | Status: AC

## 2014-11-22 MED ORDER — LOSARTAN POTASSIUM 100 MG PO TABS: 100.0000 mg | ORAL_TABLET | Freq: Every day | ORAL | Status: AC

## 2014-11-22 MED ORDER — CLOPIDOGREL BISULFATE 75 MG PO TABS: 75.0000 mg | ORAL_TABLET | Freq: Every day | ORAL | Status: AC

## 2014-11-22 MED ORDER — CARVEDILOL 25 MG PO TABS: 25.0000 mg | ORAL_TABLET | Freq: Two times a day (BID) | ORAL | Status: AC

## 2014-11-22 MED ORDER — METFORMIN HCL 850 MG PO TABS: 850.0000 mg | ORAL_TABLET | Freq: Two times a day (BID) | ORAL | Status: DC

## 2014-11-22 MED ORDER — CLOPIDOGREL BISULFATE 75 MG PO TABS
75.0000 mg | ORAL_TABLET | Freq: Every day | ORAL | Status: DC
  Administered 2014-11-22 (×2): 75 mg via ORAL
  Filled 2014-11-22 (×2): qty 1

## 2014-11-22 NOTE — Progress Notes (Signed)
Patient Name: Tristan Shepherd Date of Encounter: 11/22/2014   Principal Problem:   NSTEMI (non-ST elevated myocardial infarction) Active Problems:   CAD (coronary artery disease)   DM type 2 (diabetes mellitus, type 2)   HTN (hypertension)   COPD (chronic obstructive pulmonary disease)   Tobacco abuse   Hyperlipidemia   SUBJECTIVE  No chest pain or sob.  Ambulating w/o difficulty.  Eager to go home.  CURRENT MEDS . amLODipine  10 mg Oral Daily  . aspirin  81 mg Oral Daily  . carvedilol  25 mg Oral BID WC  . cyclobenzaprine  10 mg Oral QHS  . heparin  5,000 Units Subcutaneous 3 times per day  . insulin aspart  0-15 Units Subcutaneous TID WC  . isosorbide mononitrate  30 mg Oral Daily  . linagliptin  5 mg Oral Daily  . losartan  100 mg Oral Daily  . pantoprazole  40 mg Oral Daily  . pravastatin  40 mg Oral q1800  . sodium chloride  3 mL Intravenous Q12H  . tiotropium  18 mcg Inhalation Daily    OBJECTIVE  Filed Vitals:   11/21/14 2343 11/22/14 0441 11/22/14 0800 11/22/14 0840  BP: 155/79 168/83 143/65   Pulse: 84 88 81   Temp: 98.6 F (37 C) 98.2 F (36.8 C) 97.9 F (36.6 C)   TempSrc: Oral Oral Oral   Resp: Height:      Weight:    149 lb 7.6 oz (67.8 kg)  SpO2:   95%     Intake/Output Summary (Last 24 hours) at 11/22/14 1016 Last data filed at 11/22/14 1610  Gross per 24 hour  Intake    640 ml  Output      0 ml  Net    640 ml   Filed Weights   11/21/14 0337 11/22/14 0840  Weight: 154 lb 5.2 oz (70 kg) 149 lb 7.6 oz (67.8 kg)    PHYSICAL EXAM  General: Pleasant, NAD. Neuro: Alert and oriented X 3. Moves all extremities spontaneously. Psych: Normal affect. HEENT:  Normal  Neck: Supple without bruits or JVD. Lungs:  Resp regular and unlabored, CTA. Heart: RRR no s3, s4, or murmurs. Abdomen: Soft, non-tender, non-distended, BS + x 4.  Extremities: No clubbing, cyanosis or edema. DP/PT/Radials 2+ and equal bilaterally.  R wrist w/o  bleeding/bruit/hematoma.  Accessory Clinical Findings  CBC  Recent Labs  11/20/14 2112  WBC 5.0  NEUTROABS 3.1  HGB 12.4*  HCT 37.3*  MCV 86.5  PLT 108*   Basic Metabolic Panel  Recent Labs  11/20/14 2112 11/21/14 0820  NA 137 137  K 3.7 4.5  CL 103 98  CO2 23 27  GLUCOSE 220* 275*  BUN 8 7  CREATININE 1.07 0.90  CALCIUM 9.1 9.2  MG 1.6  --    Liver Function Tests  Recent Labs  11/20/14 2112  AST 30  ALT 23  ALKPHOS 59  BILITOT 0.8  PROT 6.9  ALBUMIN 3.5   Cardiac Enzymes  Recent Labs  11/21/14 0820 11/21/14 1331 11/21/14 1857  TROPONINI 3.58* 5.01* <0.03   Fasting Lipid Panel  Recent Labs  11/21/14 0126  CHOL 155  HDL 31*  LDLCALC 88  TRIG 960*  CHOLHDL 5.0   Thyroid Function Tests  Recent Labs  11/20/14 2112  TSH 1.647    TELE  rsr  ECG  Rsr, 72, LVH.  Radiology/Studies  No results found.  ASSESSMENT AND PLAN  1.  NSTEMI/CAD:  S/p emergent cath in setting of intermittent ST elevation in V3-4.  Cath showed calcified dzs with occluded RV branch.  Med Rx.  No further chest pain.  Ambulating w/o difficulty and f/u in [place] with A. Parachos.  Plan for d/c today on asa, bb, arb, nitrate, statin.  Add plavix in setting of ACS.  2.  HTN:  BP trends in 140's - 150's.  He's on max dose coreg, amlodipine, and losartan.  Titrate imdur to 60.  Will need outpt f/u and potentially hydralazine.  3.  HL:  LDL 88.  Cont high potency statin.  4.  Tob Abuse/COPD:  No active wheezing. Cessation advised.  He is not interested in quitting.  5.  DM II:  Sugars running in 200's.  Cont [place]ajanuvia.  Resume metformin on d/c.  Signed, Nicolasa Duckinghristopher Berge NP   History and all data above reviewed.  Patient examined.  I agree with the findings as above.  No chest pain.  No SOB.The patient exam reveals COR:RRR  ,  Lungs: Clear  ,  Abd: Positive bowel sounds, no rebound no guarding, Ext Right wrist OK  .  All available labs, radiology testing,  previous records reviewed. Agree with documented assessment and plan. CAD:  Medical management.  No further work up.  Meds and plans as listed.    Tristan Shepherd  11:14 AM  11/22/2014

## 2014-11-22 NOTE — Discharge Instructions (Signed)
**  PLEASE REMEMBER TO BRING ALL OF YOUR MEDICATIONS TO EACH OF YOUR FOLLOW-UP OFFICE VISITS. ° °NO HEAVY LIFTING X 4 WEEKS. °NO SEXUAL ACTIVITY X 4 WEEKS. °NO DRIVING X 2 WEEKS. °NO SOAKING BATHS, HOT TUBS, POOLS, ETC., X 7 DAYS. ° °Radial Site Care °Refer to this sheet in the next few weeks. These instructions provide you with information on caring for yourself after your procedure. Your caregiver may also give you more specific instructions. Your treatment has been planned according to current medical practices, but problems sometimes occur. Call your caregiver if you have any problems or questions after your procedure. °HOME CARE INSTRUCTIONS °· You may shower the day after the procedure. Remove the bandage (dressing) and gently wash the site with plain soap and water. Gently pat the site dry.  °· Do not apply powder or lotion to the site.  °· Do not submerge the affected site in water for 3 to 5 days.  °· Inspect the site at least twice daily.  °· Do not flex or bend the affected arm for 24 hours.  °· No lifting over 5 pounds (2.3 kg) for 5 days after your procedure.  °· Do not drive home if you are discharged the same day of the procedure. Have someone else drive you.  ° °What to expect: °· Any bruising will usually fade within 1 to 2 weeks.  °· Blood that collects in the tissue (hematoma) may be painful to the touch. It should usually decrease in size and tenderness within 1 to 2 weeks.  °SEEK IMMEDIATE MEDICAL CARE IF: °· You have unusual pain at the radial site.  °· You have redness, warmth, swelling, or pain at the radial site.  °· You have drainage (other than a small amount of blood on the dressing).  °· You have chills.  °· You have a fever or persistent symptoms for more than 72 hours.  °· You have a fever and your symptoms suddenly get worse.  °· Your arm becomes pale, cool, tingly, or numb.  °· You have heavy bleeding from the site. Hold pressure on the site.  ° °

## 2014-11-22 NOTE — Progress Notes (Signed)
Pt up ambulating without problems. Very anxious, wants to go home "I've been here long enough". Pt admits that part of his anxiety is wanting a cigarette. Pt declines nicotine patch. Pt without questions regarding his heart or risk factors.  Ethelda ChickKristan Bensyn Bornemann CES, ACSM [phone] 10:15 AM 11/22/2014

## 2014-11-22 NOTE — Discharge Summary (Signed)
Discharge Summary   Patient ID: VON QUINTANAR,  MRN: [id number], DOB/AGE: 66-Sep-1952 66 y.o.  Admit date: 11/20/2014 Discharge date: 11/22/2014  Primary Care Provider: [facility] Primary Cardiologist: A. Paraschos, MD  Discharge Diagnoses Principal Problem:   NSTEMI (non-ST elevated myocardial infarction)  **S/P Catheterization this admission revealing an occluded RV branch  Med Rx.  Active Problems:   CAD (coronary artery disease)   DM type 2 (diabetes mellitus, type 2)   HTN (hypertension)   COPD (chronic obstructive pulmonary disease)   Tobacco abuse   Hyperlipidemia   Allergies No Known Allergies  Procedures  Cardiac Catheterization 1.19.2016  Procedural Findings: Hemodynamics: AO 166/62 mean 105 mm Hg LV 166/11 mm Hg  Coronary angiography: Coronary dominance: right  Left mainstem: Calcified without obstructive disease.  Left anterior descending (LAD): The LAD is calcified proximally. There appears to be a stent in the mid vessel that is widely patent. There is scattered disease in the LAD less than 20-30%. The first diagonal has a high take off and has a 50% stenosis in the mid vessel. The second diagonal has 30% disease  Left circumflex (LCx): The LCx is heavily calcified throughout. There is no obstructive disease.    Right coronary artery (RCA):  The RCA has a long stent in the proximal vessel that is widely patent. In the mid vessel there is a focal 30-40% lesion. The distal RCA has scattered disease less than 20%. The PDA has calcific plaque of 30-40%. The first RV marginal branch is occluded in the proximal to mid vessel.    Left ventriculography: Left ventricular systolic function is normal, LVEF is estimated at 55-65%, there is no significant mitral regurgitation   Final Conclusions:   1. Single vessel obstructive CAD. The only significant obstruction is in the RV marginal branch.   2. Normal LV function.  Recommendations: The patient is  currently pain free. It is possible that the RV marginal branch occlusion could be his acute event but is a small vessel. I would not recommend PCI. Will treat medically and cycle cardiac enzymes and Ecg.. Check Echo in am. Overall anatomy looks favorable.  _____________   2D Echocardiogram 1.20.2016  Study Conclusions  - Left ventricle: The cavity size was normal. Wall thickness was   normal. The estimated ejection fraction was 55%. Basal   inferolateral and basal anterolateral hypokinesis. Doppler   parameters are consistent with abnormal left ventricular   relaxation (grade 1 diastolic dysfunction). - Aortic valve: There was no stenosis. - Mitral valve: There was no regurgitation. - Right ventricle: The cavity size was normal. Systolic function   was normal. - Pulmonary arteries: No complete TR doppler jet so unable to   estimate PA systolic pressure. - Inferior vena cava: The vessel was normal in size. The   respirophasic diameter changes were in the normal range (>= 50%),   consistent with normal central venous pressure. _____________   History of Present Illness  64 year old male with a prior history of coronary artery disease status post PTCA of the left circumflex in 1990s 3 with subsequent bare metal stenting of the LAD and right coronary artery in 1999. He is followed by cardiology in [place]. He was in his usual state of health until the early evening of January 19, when he developed severe substernal chest discomfort with radiation to his left arm. He presented to [facility] where he was noted to have 2 mm ST segment elevation in lead V3 with 1 mm  elevation in V4. Patient was treated with heparin and nitroglycerin and a code STEMI was activated. He was [facility] for further evaluation.  Hospital Course  Patient underwent emergent diagnostic cardiac catheterization as he continued to have chest pain. Diagnostic catheterization revealed  relatively nonobstructive coronary artery disease with a patent stent in the LAD. The RV marginal branch of the right coronary artery however, was occluded in the proximal to mid section. The RV marginal occlusion was the only area identified to potentially explain his symptoms. It was not felt to be amenable to PCI and therefore medical therapy was recommended. Following catheterization, Mr. Dayton ScrapeMurray was admitted to stepdown where he ruled in, eventually peaking his troponin at 5.01. He was hypertensive throughout his hospitalization and we have titrated his carvedilol and losartan to 25 mg twice a day and 100 mg daily, respectively. In the setting of acute coronary syndrome, we have added clopidogrel therapy and have also titrated his long-acting nitrate therapy. He has had no further chest discomfort and has been ambulating without difficulty. He has been seen by cardiac rehabilitation and outpatient referral will be made. He has been counseled on the importance of smoking cessation though he says he is not interested in quitting. He will be discharged home today in good condition.  Discharge Vitals Blood pressure 143/65, pulse 81, temperature 97.9 F (36.6 C), temperature source Oral, resp. rate 18, height 5\' 7"  (1.702 m), weight 149 lb 7.6 oz (67.8 kg), SpO2 95 %.  Filed Weights   11/21/14 0337 11/22/14 0840  Weight: 154 lb 5.2 oz (70 kg) 149 lb 7.6 oz (67.8 kg)    Labs  CBC  Recent Labs  11/20/14 2112  WBC 5.0  NEUTROABS 3.1  HGB 12.4*  HCT 37.3*  MCV 86.5  PLT 108*   Basic Metabolic Panel  Recent Labs  11/20/14 2112 11/21/14 0820  NA 137 137  K 3.7 4.5  CL 103 98  CO2 23 27  GLUCOSE 220* 275*  BUN 8 7  CREATININE 1.07 0.90  CALCIUM 9.1 9.2  MG 1.6  --    Liver Function Tests  Recent Labs  11/20/14 2112  AST 30  ALT 23  ALKPHOS 59  BILITOT 0.8  PROT 6.9  ALBUMIN 3.5   Cardiac Enzymes  Recent Labs  11/21/14 0820 11/21/14 1331 11/21/14 1857  TROPONINI  3.58* 5.01* <0.03   Fasting Lipid Panel  Recent Labs  11/21/14 0126  CHOL 155  HDL 31*  LDLCALC 88  TRIG [id number]*  CHOLHDL 5.0   Thyroid Function Tests  Recent Labs  11/20/14 2112  TSH 1.647    Disposition  Pt is being discharged home today in good condition.  Follow-up Plans & Appointments  Follow-up Information    [facility]   Specialty:  Family Medicine   Why:  as scheduled.   Contact information:   [address] [facility] [place] [id number] [phone]       Follow up with Marcina MillardPARASCHOS,ALEXANDER, MD.   Specialty:  Cardiology   Why:  1 week.   Contact information:   [address] [place] [id number] [phone]       Discharge Medications    Medication List    STOP taking these medications        naproxen sodium 220 MG tablet  Commonly known as:  ANAPROX      TAKE these medications  albuterol 108 (90 BASE) MCG/ACT inhaler  Commonly known as:  PROVENTIL HFA;VENTOLIN HFA  Inhale 3 puffs into the lungs every 6 (six) hours as needed for wheezing or shortness of breath.     amLODipine 10 MG tablet  Commonly known as:  NORVASC  10 mg daily.     aspirin EC 81 MG tablet  Take 81 mg by mouth daily.     carvedilol 25 MG tablet  Commonly known as:  COREG  Take 1 tablet (25 mg total) by mouth 2 (two) times daily with a meal.     clopidogrel 75 MG tablet  Commonly known as:  PLAVIX  Take 1 tablet (75 mg total) by mouth daily.     isosorbide mononitrate 60 MG 24 hr tablet  Commonly known as:  IMDUR  Take 1 tablet (60 mg total) by mouth daily.     lansoprazole 30 MG capsule  Commonly known as:  PREVACID  Take 30 mg by mouth daily.     losartan 100 MG tablet  Commonly known as:  COZAAR  Take 1 tablet (100 mg total) by mouth daily.     metFORMIN 850 MG tablet  Commonly known as:  GLUCOPHAGE  Take 1 tablet (850 mg total) by mouth 2 (two) times daily with a meal.      nitroGLYCERIN 0.4 MG SL tablet  Commonly known as:  NITROSTAT  Place 0.4 mg under the tongue every 5 (five) minutes as needed for chest pain.     pravastatin 40 MG tablet  Commonly known as:  PRAVACHOL  Take 1 tablet (40 mg total) by mouth daily at 6 PM.     sitaGLIPtin 50 MG tablet  Commonly known as:  JANUVIA  Take 50 mg by mouth daily.     tiotropium 18 MCG inhalation capsule  Commonly known as:  SPIRIVA  Place 18 mcg into inhaler and inhale daily.     Vitamin D3 1000 UNITS Caps  Take 1,000 Units by mouth every evening.       Outstanding Labs/Studies  None  Duration of Discharge Encounter   Greater than 30 minutes including physician time.  Signed, Nicolasa Ducking NP 11/22/2014, 10:41 AM  Patient seen and examined.  Plan as discussed in my rounding note for today and outlined above. Fayrene Fearing Isaiah Torok  11/22/2014  11:10 AM

## 2014-12-05 ENCOUNTER — Telehealth: Payer: Self-pay | Admitting: Cardiology

## 2014-12-06 NOTE — Telephone Encounter (Signed)
Close encounter 

## 2014-12-21 ENCOUNTER — Ambulatory Visit (INDEPENDENT_AMBULATORY_CARE_PROVIDER_SITE_OTHER): Payer: Medicare Other | Admitting: Physician Assistant

## 2014-12-21 ENCOUNTER — Encounter: Payer: Self-pay | Admitting: Physician Assistant

## 2014-12-21 VITALS — BP 164/72 | HR 78 | Ht 67.0 in | Wt 158.4 lb

## 2014-12-21 DIAGNOSIS — Z720 Tobacco use: Secondary | ICD-10-CM

## 2014-12-21 DIAGNOSIS — I209 Angina pectoris, unspecified: Secondary | ICD-10-CM

## 2014-12-21 DIAGNOSIS — E785 Hyperlipidemia, unspecified: Secondary | ICD-10-CM

## 2014-12-21 DIAGNOSIS — I10 Essential (primary) hypertension: Secondary | ICD-10-CM

## 2014-12-21 DIAGNOSIS — I2583 Coronary atherosclerosis due to lipid rich plaque: Secondary | ICD-10-CM

## 2014-12-21 DIAGNOSIS — I739 Peripheral vascular disease, unspecified: Secondary | ICD-10-CM

## 2014-12-21 DIAGNOSIS — I2510 Atherosclerotic heart disease of native coronary artery without angina pectoris: Secondary | ICD-10-CM

## 2014-12-21 DIAGNOSIS — R079 Chest pain, unspecified: Secondary | ICD-10-CM

## 2014-12-21 MED ORDER — HYDRALAZINE HCL 25 MG PO TABS: 25.0000 mg | ORAL_TABLET | Freq: Two times a day (BID) | ORAL | Status: DC

## 2014-12-21 MED ORDER — ISOSORBIDE MONONITRATE ER 60 MG PO TB24: 90.0000 mg | ORAL_TABLET | Freq: Every day | ORAL | Status: AC

## 2014-12-21 NOTE — Assessment & Plan Note (Signed)
Continue statin. 

## 2014-12-21 NOTE — Assessment & Plan Note (Signed)
He has cut back some however he is not interested in quitting.

## 2014-12-21 NOTE — Patient Instructions (Addendum)
Get some over the counter magnesium powder and take every other day as directed on the bottle and see if that helps muscle cramping.  INCREASE Isosorbide to 90mg  daily ( 1 1/2 tablets daily).  START Hydralazine 25mg  twice a day.  Your physician has requested that you have an ankle brachial index (ABI). During this test an ultrasound and blood pressure cuff are used to evaluate the arteries that supply the arms and legs with blood. Allow thirty minutes for this exam. There are no restrictions or special instructions.  Your physician recommends that you schedule a follow-up appointment in: 3 months with Dr. Peter [place].

## 2014-12-21 NOTE — Assessment & Plan Note (Signed)
Increase Imdur to 90mg daily

## 2014-12-21 NOTE — Progress Notes (Signed)
Patient ID: Tristan Shepherd, male   DOB: Jul 15, 1951, 64 y.o.   MRN: [id number]    Date:  12/21/2014   ID:  Tristan Shepherd, DOB [birth year], MRN [id number]  PCP:  Marina Goodell, MD  Primary Cardiologist:jordan    Chief Complaint  Patient presents with  . Follow-up    Post Hospital:  [place]. chest discomfort - took 1 NTG this AM (his dog upset him).  No SOB or dizziness.  Left ankle and foot stays swollen all the time.     History of Present Illness: Tristan Shepherd is a 64 y.o. male 64 year old male with a prior history of coronary artery disease status post PTCA of the left circumflex in 1990s with subsequent bare metal stenting of the LAD and right coronary artery in 1999. He was followed by cardiology in [place] previously but due to insurance changes he will come here. He was in his usual state of health until the early evening of January 19, when he developed severe substernal chest discomfort with radiation to his left arm. He presented to [facility] where he was noted to have 2 mm ST segment elevation in lead V3 with 1 mm elevation in V4. Patient was treated with heparin and nitroglycerin and a code STEMI was activated. He was transferred to [facility] for further evaluation.  Patient underwent emergent diagnostic cardiac catheterization as he continued to have chest pain. Diagnostic catheterization revealed relatively nonobstructive coronary artery disease with a patent stent in the LAD. The RV marginal branch of the right coronary artery however, was occluded in the proximal to mid section. The RV marginal occlusion was the only area identified to potentially explain his symptoms. It was not felt to be amenable to PCI and therefore medical therapy was recommended. Following catheterization, [name] was admitted to stepdown where he ruled in, eventually peaking his troponin at 5.01. He was hypertensive throughout his hospitalization and we have titrated his  carvedilol and losartan to 25 mg twice a day and 100 mg daily, respectively. In the setting of acute coronary syndrome, we have added clopidogrel therapy and have also titrated his long-acting nitrate therapy. He has had no further chest discomfort and ambulated without difficulty. He was seen by cardiac rehabilitation and outpatient referral will be made. He has been counseled on the importance of smoking cessation though he says he is not interested in quitting.   He presents today for posthospital follow-up. Ports an episode of chest pain after getting upset with his dog. He did use a sublingual nitroglycerin with relief. Also reports intermittent cramping all over his body which is been ongoing for over a year. He can go a week without any cramps none have couple days worth. He also gets pain in the lower extremities with ambulation of about 100 yards. This resolves with a couple minutes of rest and then he can continue on. He denies shortness of breath beyond baseline.   The patient currently denies nausea, vomiting, fever, orthopnea, dizziness, PND, cough, congestion, abdominal pain, hematochezia, melena, lower extremity edema.  Wt Readings from Last 3 Encounters:  12/21/14 158 lb 6.4 oz (71.85 kg)  11/22/14 149 lb 7.6 oz (67.8 kg)     Past Medical History  Diagnosis Date  . CAD (coronary artery disease)     a. 1993 PTCA LCX;  b. 1999 PCI/BMS to RCA and LAD;  c. 11/2014 LM Ca2+, LAD patent mid stent, 20-53m, D1 25m, D2 30, LCX Ca2+, RCA 30-14m,  20d, PDA 30-40, RV 100p/m, EF 55-65%;  d. 11/2014 Echo: EF 55%, Gr 1 DD.  [name]. Essential hypertension   . Hyperlipidemia   . Tobacco abuse   . Colon polyps   . Type II diabetes mellitus   . Diabetic mononeuropathy   . COPD (chronic obstructive pulmonary disease)     "beginning stages" (11/20/2014)  . GERD (gastroesophageal reflux disease)   . Arthritis     "touch; fingers; knees" (11/20/2014)  . Degenerative arthritis of left knee     Current  Outpatient Prescriptions  Medication Sig Dispense Refill  . albuterol (PROVENTIL HFA;VENTOLIN HFA) 108 (90 BASE) MCG/ACT inhaler Inhale 3 puffs into the lungs every 6 (six) hours as needed for wheezing or shortness of breath.    [name]. amLODipine (NORVASC) 10 MG tablet 10 mg daily.     [name]. aspirin EC 81 MG tablet Take 81 mg by mouth daily.     . carvedilol (COREG) 25 MG tablet Take 1 tablet (25 mg total) by mouth 2 (two) times daily with a meal. 60 tablet 6  . Cholecalciferol (VITAMIN D3) 1000 UNITS CAPS Take 1,000 Units by mouth every evening.     . clopidogrel (PLAVIX) 75 MG tablet Take 1 tablet (75 mg total) by mouth daily. 30 tablet 6  . hydrALAZINE (APRESOLINE) 25 MG tablet Take 1 tablet (25 mg total) by mouth 2 (two) times daily. 60 tablet 6  . isosorbide mononitrate (IMDUR) 60 MG 24 hr tablet Take 1.5 tablets (90 mg total) by mouth daily. 45 tablet 6  . lansoprazole (PREVACID) 30 MG capsule Take 30 mg by mouth daily.     [name]. losartan (COZAAR) 100 MG tablet Take 1 tablet (100 mg total) by mouth daily. 30 tablet 6  . metFORMIN (GLUCOPHAGE) 850 MG tablet Take 1 tablet (850 mg total) by mouth 2 (two) times daily with a meal.    . nitroGLYCERIN (NITROSTAT) 0.4 MG SL tablet Place 0.4 mg under the tongue every 5 (five) minutes as needed for chest pain.     . pravastatin (PRAVACHOL) 40 MG tablet Take 1 tablet (40 mg total) by mouth daily at 6 PM. 30 tablet 6  . sitaGLIPtin (JANUVIA) 50 MG tablet Take 50 mg by mouth daily.     [name]. tiotropium (SPIRIVA) 18 MCG inhalation capsule Place 18 mcg into inhaler and inhale daily.      No current facility-administered medications for this visit.    Allergies:   No Known Allergies  Social History:  The patient  reports that he has been smoking Cigarettes.  He has a 75 pack-year smoking history. He has never used smokeless tobacco. He reports that he drinks about 21.0 oz of alcohol per week. He reports that he does not use illicit drugs.   Family history:  No family  history on file.  ROS:  Please see the history of present illness.  All other systems reviewed and negative.   PHYSICAL EXAM: VS:  BP 164/72 mmHg  Pulse 78  Ht 5\' 7"  (1.702 m)  Wt 158 lb 6.4 oz (71.85 kg)  BMI 24.80 kg/m2 Well nourished, well developed, in no acute distress HEENT: Pupils are equal round react to light accommodation extraocular movements are intact.  Neck: no JVDNo cervical lymphadenopathy. Cardiac: Regular rate and rhythm without murmurs rubs or gallops. Lungs:  clear to auscultation bilaterally, no wheezing, rhonchi or rales Abd: soft, nontender, positive bowel sounds all quadrants, no hepatosplenomegaly Ext: no lower extremity edema.  2+ radial and severely  diminished pedal pulses Skin: warm and dry Neuro:  Grossly normal  EKG:  Normal sinus rhythm rate 78 bpm T-wave inversion V1 through 3  ASSESSMENT AND PLAN:  Problem List Items Addressed This Visit    Tobacco abuse    He has cut back some however he is not interested in quitting.      Hyperlipidemia    Continue statin      Relevant Medications   hydrALAZINE (APRESOLINE) tablet   isosorbide mononitrate (IMDUR) 24 hr tablet   HTN (hypertension)    Blood pressure is not well-controlled. He is oriented Coreg 25 and losartan 100. Will add hydralazine 25 twice a day.      Relevant Medications   hydrALAZINE (APRESOLINE) tablet   isosorbide mononitrate (IMDUR) 24 hr tablet   Claudication    He's been smoking for last 50 years which certainly sets him up for peripheral vascular disease. He is describing claudication symptoms and has diminished pulses bilaterally. Will check ABIs. If they're abnormal, we'll get full Dopplers.      Relevant Orders   Ankle brachial index   CAD (coronary artery disease)   Relevant Medications   hydrALAZINE (APRESOLINE) tablet   isosorbide mononitrate (IMDUR) 24 hr tablet   Angina pectoris    Increase Imdur to 90 mg daily.      Relevant Medications   hydrALAZINE  (APRESOLINE) tablet   isosorbide mononitrate (IMDUR) 24 hr tablet    Other Visit Diagnoses    Chest pain, unspecified chest pain type    -  Primary    Relevant Orders    EKG 12-Lead       Kashon Kraynak, PAC

## 2014-12-21 NOTE — Assessment & Plan Note (Signed)
He's been smoking for last 50 years which certainly sets him up for peripheral vascular disease. He is describing claudication symptoms and has diminished pulses bilaterally. Will check ABIs. If they're abnormal, we'll get full Dopplers.

## 2014-12-21 NOTE — Assessment & Plan Note (Signed)
Blood pressure is not well-controlled. He is oriented Coreg 25 and losartan 100. Will add hydralazine 25 twice a day.

## 2014-12-26 ENCOUNTER — Ambulatory Visit (HOSPITAL_COMMUNITY)
Admission: RE | Admit: 2014-12-26 | Discharge: 2014-12-26 | Disposition: A | Discharge status: Home / Self Care | Payer: Medicare Other | Source: Ambulatory Visit | Attending: Cardiology | Admitting: Cardiology

## 2014-12-26 DIAGNOSIS — I739 Peripheral vascular disease, unspecified: Secondary | ICD-10-CM | POA: Insufficient documentation

## 2014-12-26 NOTE — Progress Notes (Signed)
ABI Completed. °Brianna L Mazza,RVT °

## 2015-01-23 ENCOUNTER — Other Ambulatory Visit: Payer: Self-pay

## 2015-01-23 MED ORDER — HYDRALAZINE HCL 25 MG PO TABS: 25.0000 mg | ORAL_TABLET | Freq: Two times a day (BID) | ORAL | Status: DC

## 2015-02-26 ENCOUNTER — Ambulatory Visit: Payer: Medicare Other | Admitting: Cardiology

## 2015-03-28 ENCOUNTER — Encounter: Payer: Self-pay | Admitting: Cardiology

## 2015-03-28 ENCOUNTER — Ambulatory Visit (INDEPENDENT_AMBULATORY_CARE_PROVIDER_SITE_OTHER): Payer: Medicare Other | Admitting: Cardiology

## 2015-03-28 VITALS — BP 140/66 | HR 74 | Ht 66.0 in | Wt 157.3 lb

## 2015-03-28 DIAGNOSIS — Z720 Tobacco use: Secondary | ICD-10-CM

## 2015-03-28 DIAGNOSIS — I10 Essential (primary) hypertension: Secondary | ICD-10-CM

## 2015-03-28 DIAGNOSIS — I2510 Atherosclerotic heart disease of native coronary artery without angina pectoris: Secondary | ICD-10-CM | POA: Diagnosis not present

## 2015-03-28 DIAGNOSIS — E119 Type 2 diabetes mellitus without complications: Secondary | ICD-10-CM | POA: Diagnosis not present

## 2015-03-28 NOTE — Progress Notes (Signed)
Patient ID: Tristan Shepherd, male   DOB: 05-Oct-1951, 64 y.o.   MRN: [id number]    Date:  03/28/2015   ID:  Tristan Shepherd, DOB May 27, 1951, MRN [id number]  PCP:  Marina Goodell, MD  Primary Cardiologist: [place]   Chief Complaint  Patient presents with  . Follow-up    some dizzines on and off, no leg issues. Would like test results     History of Present Illness: Tristan Shepherd is a 64 y.o. male 64 year old male with a prior history of coronary artery disease status post PTCA of the left circumflex in 1990s with subsequent bare metal stenting of the LAD and right coronary artery in 1999. He was followed by cardiology in [place] previously but due to insurance changes he will come here. He was admitted November 20, 2014 with STEMI.   Patient underwent emergent diagnostic cardiac catheterization as he continued to have chest pain. Diagnostic catheterization revealed relatively nonobstructive coronary artery disease with a patent stent in the LAD. The RV marginal branch of the right coronary artery however, was occluded in the proximal to mid section. The RV marginal occlusion was the only area identified to potentially explain his symptoms. It was not felt to be amenable to PCI and therefore medical therapy was recommended. Following catheterization,  his troponin peaked at 5.01. He was treated medically.  On followup today he is doing very well. Denies chest pain or SOB. Continues to smoke. Has no plans to quit. Legs are doing well without significant claudication. Labs followed with primary care.   Wt Readings from Last 3 Encounters:  03/28/15 71.351 kg (157 lb 4.8 oz)  12/21/14 71.85 kg (158 lb 6.4 oz)  11/22/14 67.8 kg (149 lb 7.6 oz)     Past Medical History  Diagnosis Date  . CAD (coronary artery disease)     a. 1993 PTCA LCX;  b. 1999 PCI/BMS to RCA and LAD;  c. 11/2014 LM Ca2+, LAD patent mid stent, 20-49m, D1 52m, D2 30, LCX Ca2+, RCA 30-41m, 20d, PDA 30-40, RV 100p/m, EF  55-65%;  d. 11/2014 Echo: EF 55%, Gr 1 DD.  [name] Essential hypertension   . Hyperlipidemia   . Tobacco abuse   . Colon polyps   . Type II diabetes mellitus   . Diabetic mononeuropathy   . COPD (chronic obstructive pulmonary disease)     "beginning stages" (11/20/2014)  . GERD (gastroesophageal reflux disease)   . Arthritis     "touch; fingers; knees" (11/20/2014)  . Degenerative arthritis of left knee     Current Outpatient Prescriptions  Medication Sig Dispense Refill  . albuterol (PROVENTIL HFA;VENTOLIN HFA) 108 (90 BASE) MCG/ACT inhaler Inhale 3 puffs into the lungs every 6 (six) hours as needed for wheezing or shortness of breath.    [name] amLODipine (NORVASC) 10 MG tablet 10 mg daily.     [name] aspirin EC 81 MG tablet Take 81 mg by mouth daily.     . carvedilol (COREG) 25 MG tablet Take 1 tablet (25 mg total) by mouth 2 (two) times daily with a meal. 60 tablet 6  . Cholecalciferol (VITAMIN D3) 1000 UNITS CAPS Take 1,000 Units by mouth every evening.     . clopidogrel (PLAVIX) 75 MG tablet Take 1 tablet (75 mg total) by mouth daily. 30 tablet 6  . hydrALAZINE (APRESOLINE) 25 MG tablet Take 1 tablet (25 mg total) by mouth 2 (two) times daily. 180 tablet 2  . isosorbide mononitrate (IMDUR) 60 MG 24  hr tablet Take 1.5 tablets (90 mg total) by mouth daily. 45 tablet 6  . losartan (COZAAR) 100 MG tablet Take 1 tablet (100 mg total) by mouth daily. 30 tablet 6  . metFORMIN (GLUCOPHAGE) 850 MG tablet Take 1 tablet (850 mg total) by mouth 2 (two) times daily with a meal.    . nitroGLYCERIN (NITROSTAT) 0.4 MG SL tablet Place 0.4 mg under the tongue every 5 (five) minutes as needed for chest pain.     . pravastatin (PRAVACHOL) 40 MG tablet Take 1 tablet (40 mg total) by mouth daily at 6 PM. 30 tablet 6  . sitaGLIPtin (JANUVIA) 50 MG tablet Take 50 mg by mouth daily.      No current facility-administered medications for this visit.    Allergies:   No Known Allergies  Social History:  The patient   reports that he has been smoking Cigarettes.  He has a 75 pack-year smoking history. He has never used smokeless tobacco. He reports that he drinks about 21.0 oz of alcohol per week. He reports that he does not use illicit drugs.   Family history:  History reviewed. No pertinent family history.  ROS:  Please see the history of present illness.  All other systems reviewed and negative.   PHYSICAL EXAM: VS:  BP 140/66 mmHg  Pulse 74  Ht 5\' 6"  (1.676 m)  Wt 71.351 kg (157 lb 4.8 oz)  BMI 25.40 kg/m2 Well nourished, well developed, in no acute distress HEENT: Pupils are equal round react to light accommodation extraocular movements are intact.  Neck: no JVDNo cervical lymphadenopathy. Cardiac: Regular rate and rhythm without murmurs rubs or gallops. Lungs:  clear to auscultation bilaterally, no wheezing, rhonchi or rales Abd: soft, nontender, positive bowel sounds all quadrants, no hepatosplenomegaly Ext: no lower extremity edema.  2+ radial and severely diminished pedal pulses Skin: warm and dry Neuro:  Grossly normal  LE arterial dopplers: 12/26/14 show ABI .85 on the right and .91 on the left.   ASSESSMENT AND PLAN: 1. CAD with prior stents as noted above. STEMI in January 2016 with presumed culprit of RV marginal branch. Other stents patent. Recommend continued medical therapy with ASA, nitrates, metoprolol, amlodipine, and statins.  Smoking cessation encouraged.  2. HTN controlled.  3. Hyperlipidemia. Labs to be checked by primary care this summer.  4. Tobacco abuse. Needs to quit.  5. PAD. Minimally symptomatic. Encourage walking program.   6. DM on oral therapy.

## 2015-03-28 NOTE — Patient Instructions (Signed)
Continue your current therapy  I will see you in one year   

## 2015-09-01 ENCOUNTER — Other Ambulatory Visit: Payer: Self-pay | Admitting: Cardiology

## 2016-03-11 ENCOUNTER — Other Ambulatory Visit: Payer: Self-pay | Admitting: Cardiology

## 2016-03-11 NOTE — Telephone Encounter (Signed)
Rx has been sent to the pharmacy electronically. ° °

## 2016-06-21 ENCOUNTER — Other Ambulatory Visit: Payer: Self-pay | Admitting: Cardiology

## 2016-06-22 NOTE — Telephone Encounter (Signed)
Rx request sent to pharmacy.  

## 2016-09-06 ENCOUNTER — Other Ambulatory Visit: Payer: Self-pay | Admitting: Cardiology

## 2016-09-07 NOTE — Telephone Encounter (Signed)
Rx request sent to pharmacy.  

## 2016-09-15 LAB — HM DIABETES EYE EXAM

## 2016-09-17 ENCOUNTER — Encounter: Payer: Self-pay | Admitting: Cardiology

## 2016-11-07 IMAGING — CR DG CHEST 1V PORT
1 series · 1 of 1 positions shown · non-contrast
Comparison: 04/29/2011.

CLINICAL DATA: Acute onset of chest pain 1 hr ago. History of
myocardial infarction and diabetes mellitus as well as COPD.

EXAM:
PORTABLE CHEST - 1 VIEW

[ap]
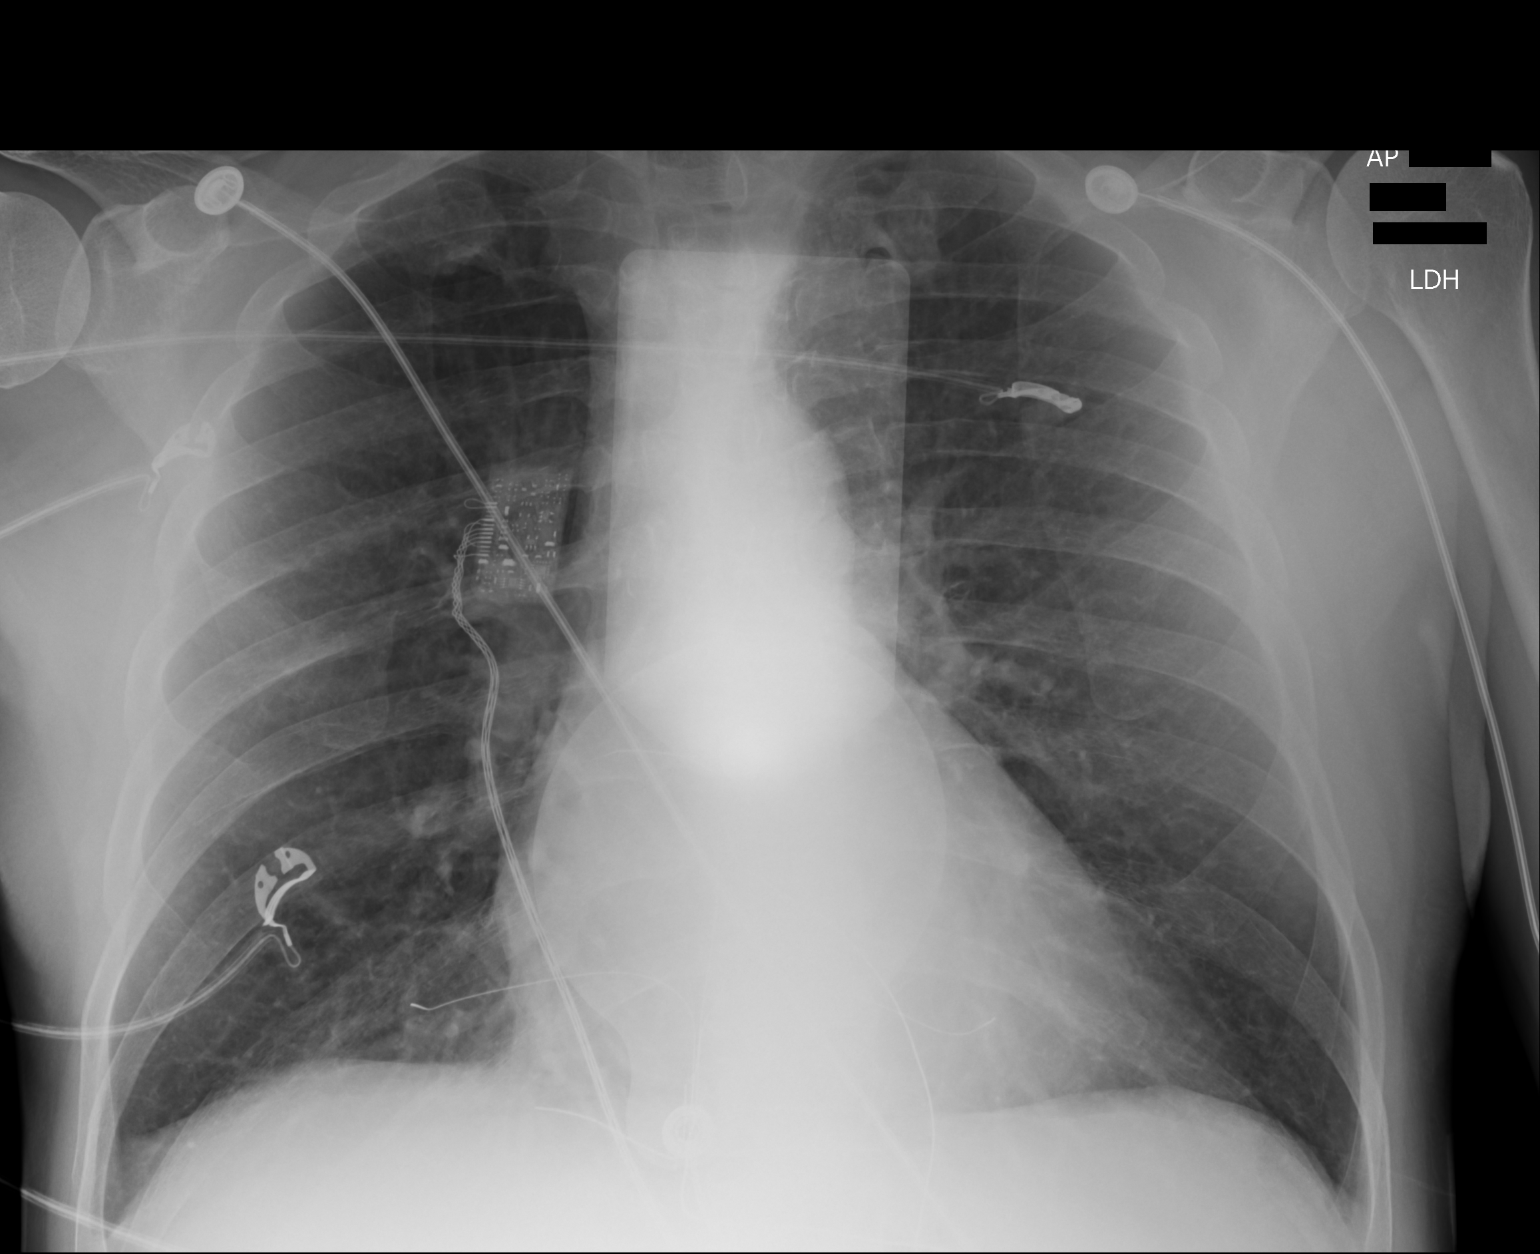

[1 of 1 positions shown; findings below may reference images not displayed]

FINDINGS: Cardiac silhouette normal in size and configuration. No mediastinal
or hilar masses. Lungs are hyperexpanded. Prominent bronchovascular
markings noted most evident in the lung bases, with additional
probable subsegmental atelectasis in the bases, most evident on the
right. No convincing pneumonia. No pulmonary edema. No pleural
effusion or gross pneumothorax on this semi-erect study.
IMPRESSION: No acute cardiopulmonary disease.

## 2016-11-19 ENCOUNTER — Ambulatory Visit: Payer: Medicare Other | Admitting: Cardiology

## 2016-12-08 NOTE — Progress Notes (Signed)
Patient ID: Tristan Shepherd, male   DOB: 1950-11-16, 66 y.o.   MRN: [id number]    Date:  12/09/2016   ID:  Tristan Shepherd, DOB 1950-11-16, MRN [id number]  PCP:  Marina GoodellFELDPAUSCH, DALE E, MD  Primary Cardiologist: [place]   Chief Complaint  Patient presents with  . Follow-up    12months;  . Dizziness    occassionally.  . Leg Pain    extreme cramping in legs.   . Coronary Artery Disease     History of Present Illness: Tristan Shepherd is a 66 y.o. male 66 year old male with a prior history of coronary artery disease status post PTCA of the left circumflex in 1990s with subsequent bare metal stenting of the LAD and right coronary artery in 1999. He was admitted November 20, 2014 with STEMI. Patient underwent emergent diagnostic cardiac catheterization as he continued to have chest pain. Diagnostic catheterization revealed relatively nonobstructive coronary artery disease with a patent stent in the LAD. The RV marginal branch of the right coronary artery however, was occluded in the proximal to mid section. The RV marginal occlusion was the only area identified to potentially explain his symptoms. It was not felt to be amenable to PCI and therefore medical therapy was recommended. Following catheterization,  his troponin peaked at 5.01. He was treated medically.  On followup today he is doing very well. States he has infrequent angina about every 2-4 weeks. Only a twinge. Unchanged since 1993.  Continues to smoke. Has no plans to quit. Denies significant claudication. Does complain of intermittent cramps that are random and migratory. States he stays active.  Wt Readings from Last 3 Encounters:  12/09/16 153 lb 3.2 oz (69.5 kg)  03/28/15 157 lb 4.8 oz (71.4 kg)  12/21/14 158 lb 6.4 oz (71.8 kg)     Past Medical History:  Diagnosis Date  . Arthritis    "touch; fingers; knees" (11/20/2014)  . CAD (coronary artery disease)    a. 1993 PTCA LCX;  b. 1999 PCI/BMS to RCA and LAD;  c. 11/2014 LM  Ca2+, LAD patent mid stent, 20-5224m, D1 5166m, D2 30, LCX Ca2+, RCA 30-7776m, 20d, PDA 30-40, RV 100p/m, EF 55-65%;  d. 11/2014 Echo: EF 55%, Gr 1 DD.  [name]. Colon polyps   . COPD (chronic obstructive pulmonary disease) (HCC)    "beginning stages" (11/20/2014)  . Degenerative arthritis of left knee   . Diabetic mononeuropathy (HCC)   . Essential hypertension   . GERD (gastroesophageal reflux disease)   . Hyperlipidemia   . Tobacco abuse   . Type II diabetes mellitus (HCC)     Current Outpatient Prescriptions  Medication Sig Dispense Refill  . albuterol (PROVENTIL HFA;VENTOLIN HFA) 108 (90 BASE) MCG/ACT inhaler Inhale 3 puffs into the lungs every 6 (six) hours as needed for wheezing or shortness of breath.    [name]. amLODipine (NORVASC) 10 MG tablet 10 mg daily.     [name]. aspirin EC 81 MG tablet Take 81 mg by mouth daily.     . carvedilol (COREG) 25 MG tablet Take 1 tablet (25 mg total) by mouth 2 (two) times daily with a meal. 60 tablet 6  . Cholecalciferol (VITAMIN D3) 1000 UNITS CAPS Take 1,000 Units by mouth every evening.     . clopidogrel (PLAVIX) 75 MG tablet Take 1 tablet (75 mg total) by mouth daily. 30 tablet 6  . hydrALAZINE (APRESOLINE) 25 MG tablet TAKE 1 TABLET BY MOUTH 2  TIMES DAILY 60 tablet 6  .  isosorbide mononitrate (IMDUR) 60 MG 24 hr tablet Take 1.5 tablets (90 mg total) by mouth daily. 45 tablet 6  . losartan (COZAAR) 100 MG tablet Take 1 tablet (100 mg total) by mouth daily. 30 tablet 6  . metFORMIN (GLUCOPHAGE) 1000 MG tablet     . nitroGLYCERIN (NITROSTAT) 0.4 MG SL tablet Place 0.4 mg under the tongue every 5 (five) minutes as needed for chest pain.     . pravastatin (PRAVACHOL) 40 MG tablet Take 1 tablet (40 mg total) by mouth daily at 6 PM. 30 tablet 6  . sitaGLIPtin (JANUVIA) 50 MG tablet Take 50 mg by mouth daily.      No current facility-administered medications for this visit.     Allergies:   No Known Allergies  Social History:  The patient  reports that he has been  smoking Cigarettes.  He has a 75.00 pack-year smoking history. He has never used smokeless tobacco. He reports that he drinks about 21.0 oz of alcohol per week . He reports that he does not use drugs.   Family history:   Family History  Problem Relation Age of Onset  . Heart disease Father     ROS:  Please see the history of present illness.  All other systems reviewed and negative.   PHYSICAL EXAM: VS:  BP 138/70 (BP Location: Left Arm, Cuff Size: Normal)   Pulse 63   Ht 5\' 6"  (1.676 m)   Wt 153 lb 3.2 oz (69.5 kg)   BMI 24.73 kg/m  Well nourished, well developed, in no acute distress  HEENT: Pupils are equal round react to light accommodation extraocular movements are intact.  Neck: no JVD No cervical lymphadenopathy. Positive bilateral carotid bruits.  Cardiac: Regular rate and rhythm without murmurs rubs or gallops.  Lungs:  clear to auscultation bilaterally, no wheezing, rhonchi or rales  Abd: soft, nontender, positive bowel sounds all quadrants, no hepatosplenomegaly  Ext: no lower extremity edema.  2+ radial and severely diminished pedal pulses Skin: warm and dry  Neuro:  Grossly normal  Laboratory data: Lab Results  Component Value Date   WBC 5.0 11/20/2014   HGB 12.4 (L) 11/20/2014   HCT 37.3 (L) 11/20/2014   PLT 108 (L) 11/20/2014   GLUCOSE 275 (H) 11/21/2014   CHOL 155 11/21/2014   TRIG 181 (H) 11/21/2014   HDL 31 (L) 11/21/2014   LDLCALC 88 11/21/2014   ALT 23 11/20/2014   AST 30 11/20/2014   NA 137 11/21/2014   K 4.5 11/21/2014   CL 98 11/21/2014   CREATININE 0.90 11/21/2014   BUN 7 11/21/2014   CO2 27 11/21/2014   TSH 1.647 11/20/2014   Labs dated 09/23/16: CMET normal. A1c 6.2%, cholesterol 137, triglycerides 148, HDL 31, LDL 77.   LE arterial dopplers: 12/26/14 show ABI .85 on the right and .91 on the left.   Ecg today shows NSR with mild voltage criteria for LVH, otherwise normal. I have personally reviewed and interpreted this  study.   ASSESSMENT AND PLAN: 1. CAD with prior stents as noted above. STEMI in January 2016 with presumed culprit of RV marginal branch. Other stents patent. He has stable angina. Recommend continued medical therapy with ASA, nitrates, metoprolol, amlodipine, and statins.  Smoking cessation encouraged.  2. HTN controlled.  3. Hyperlipidemia. Controlled on statin  4. Tobacco abuse. Recommend cessation.  5. PAD. Minimally symptomatic. Encourage walking program. Will schedule follow up LE arterial dopplers.  6. DM on oral therapy.  7. Carotid  bruits. Will arrange for carotid dopplers.

## 2016-12-09 ENCOUNTER — Ambulatory Visit (INDEPENDENT_AMBULATORY_CARE_PROVIDER_SITE_OTHER): Payer: Medicare Other | Admitting: Cardiology

## 2016-12-09 ENCOUNTER — Encounter: Payer: Self-pay | Admitting: Cardiology

## 2016-12-09 VITALS — BP 138/70 | HR 63 | Ht 66.0 in | Wt 153.2 lb

## 2016-12-09 DIAGNOSIS — E119 Type 2 diabetes mellitus without complications: Secondary | ICD-10-CM

## 2016-12-09 DIAGNOSIS — Z720 Tobacco use: Secondary | ICD-10-CM

## 2016-12-09 DIAGNOSIS — I10 Essential (primary) hypertension: Secondary | ICD-10-CM | POA: Diagnosis not present

## 2016-12-09 DIAGNOSIS — E7800 Pure hypercholesterolemia, unspecified: Secondary | ICD-10-CM

## 2016-12-09 DIAGNOSIS — I739 Peripheral vascular disease, unspecified: Secondary | ICD-10-CM

## 2016-12-09 DIAGNOSIS — I2510 Atherosclerotic heart disease of native coronary artery without angina pectoris: Secondary | ICD-10-CM

## 2016-12-09 DIAGNOSIS — R0989 Other specified symptoms and signs involving the circulatory and respiratory systems: Secondary | ICD-10-CM

## 2016-12-09 NOTE — Patient Instructions (Addendum)
Continue your current therapy  Quit smoking  We will schedule you for carotid and lower extremity arterial dopplers.  I will see you in one year.

## 2016-12-10 ENCOUNTER — Other Ambulatory Visit: Payer: Self-pay | Admitting: Cardiology

## 2016-12-10 DIAGNOSIS — I739 Peripheral vascular disease, unspecified: Secondary | ICD-10-CM

## 2016-12-10 DIAGNOSIS — R0989 Other specified symptoms and signs involving the circulatory and respiratory systems: Secondary | ICD-10-CM

## 2017-01-04 ENCOUNTER — Ambulatory Visit (HOSPITAL_COMMUNITY)
Admission: RE | Admit: 2017-01-04 | Discharge: 2017-01-04 | Disposition: A | Discharge status: Home / Self Care | Payer: Medicare Other | Source: Ambulatory Visit | Attending: Cardiology | Admitting: Cardiology

## 2017-01-04 DIAGNOSIS — Z720 Tobacco use: Secondary | ICD-10-CM | POA: Insufficient documentation

## 2017-01-04 DIAGNOSIS — E1151 Type 2 diabetes mellitus with diabetic peripheral angiopathy without gangrene: Secondary | ICD-10-CM | POA: Diagnosis not present

## 2017-01-04 DIAGNOSIS — I700 Atherosclerosis of aorta: Secondary | ICD-10-CM | POA: Diagnosis not present

## 2017-01-04 DIAGNOSIS — I2510 Atherosclerotic heart disease of native coronary artery without angina pectoris: Secondary | ICD-10-CM | POA: Insufficient documentation

## 2017-01-04 DIAGNOSIS — I739 Peripheral vascular disease, unspecified: Secondary | ICD-10-CM

## 2017-01-04 DIAGNOSIS — E785 Hyperlipidemia, unspecified: Secondary | ICD-10-CM | POA: Insufficient documentation

## 2017-01-04 DIAGNOSIS — R0989 Other specified symptoms and signs involving the circulatory and respiratory systems: Secondary | ICD-10-CM | POA: Diagnosis not present

## 2017-01-04 DIAGNOSIS — I10 Essential (primary) hypertension: Secondary | ICD-10-CM | POA: Insufficient documentation

## 2017-01-04 DIAGNOSIS — J449 Chronic obstructive pulmonary disease, unspecified: Secondary | ICD-10-CM | POA: Insufficient documentation

## 2017-01-04 DIAGNOSIS — I708 Atherosclerosis of other arteries: Secondary | ICD-10-CM | POA: Diagnosis not present

## 2017-01-04 DIAGNOSIS — I6523 Occlusion and stenosis of bilateral carotid arteries: Secondary | ICD-10-CM | POA: Insufficient documentation

## 2017-01-04 DIAGNOSIS — R938 Abnormal findings on diagnostic imaging of other specified body structures: Secondary | ICD-10-CM | POA: Insufficient documentation

## 2017-02-24 ENCOUNTER — Telehealth: Payer: Self-pay | Admitting: Cardiology

## 2017-02-24 MED ORDER — HYDRALAZINE HCL 25 MG PO TABS: 25.0000 mg | ORAL_TABLET | Freq: Two times a day (BID) | ORAL | 3 refills | Status: AC

## 2017-02-24 NOTE — Telephone Encounter (Signed)
New message        *STAT* If patient is at the pharmacy, call can be transferred to refill team.   1. Which medications need to be refilled? (please list name of each medication and dose if known)  Hydralazine  2. Which pharmacy/location (including street and city if local pharmacy) is medication to be sent to? [facility] 3. Do they need a 30 day or 90 day supply? 90 day with refills

## 2017-02-24 NOTE — Telephone Encounter (Signed)
Pt's medication was sent to pt's pharmacy as requested. Confirmation received.  °

## 2017-11-02 ENCOUNTER — Other Ambulatory Visit: Payer: Self-pay

## 2017-11-02 ENCOUNTER — Ambulatory Visit
Admission: EM | Admit: 2017-11-02 | Discharge: 2017-11-02 | Disposition: A | Discharge status: Home / Self Care | Payer: Medicare Other | Attending: Family Medicine | Admitting: Family Medicine

## 2017-11-02 DIAGNOSIS — R059 Cough, unspecified: Secondary | ICD-10-CM

## 2017-11-02 DIAGNOSIS — R062 Wheezing: Secondary | ICD-10-CM

## 2017-11-02 DIAGNOSIS — R05 Cough: Secondary | ICD-10-CM | POA: Diagnosis not present

## 2017-11-02 DIAGNOSIS — J111 Influenza due to unidentified influenza virus with other respiratory manifestations: Secondary | ICD-10-CM | POA: Diagnosis not present

## 2017-11-02 DIAGNOSIS — R509 Fever, unspecified: Secondary | ICD-10-CM

## 2017-11-02 MED ORDER — DM-GUAIFENESIN ER 30-600 MG PO TB12: 1.0000 | ORAL_TABLET | Freq: Two times a day (BID) | ORAL | 0 refills | Status: AC

## 2017-11-02 MED ORDER — ALBUTEROL SULFATE HFA 108 (90 BASE) MCG/ACT IN AERS: 1.0000 | INHALATION_SPRAY | Freq: Four times a day (QID) | RESPIRATORY_TRACT | 1 refills | Status: AC | PRN

## 2017-11-02 MED ORDER — ACETAMINOPHEN 325 MG PO TABS
650.0000 mg | ORAL_TABLET | Freq: Once | ORAL | Status: AC
  Administered 2017-11-02: 650 mg via ORAL

## 2017-11-02 MED ORDER — ALBUTEROL SULFATE (2.5 MG/3ML) 0.083% IN NEBU
2.5000 mg | INHALATION_SOLUTION | Freq: Once | RESPIRATORY_TRACT | Status: AC
  Administered 2017-11-02: 2.5 mg via RESPIRATORY_TRACT

## 2017-11-02 MED ORDER — PREDNISONE 20 MG PO TABS: 20.0000 mg | ORAL_TABLET | Freq: Two times a day (BID) | ORAL | Status: AC

## 2017-11-02 NOTE — ED Provider Notes (Signed)
[facility]    CSN: [id number] Arrival date & time: 11/02/17  1210     History   Chief Complaint Chief Complaint  Patient presents with  . Weakness    HPI Tristan Shepherd is a 67 y.o. male with PMH of CAD, HTN, COPD presented to clinic with CC of fever, cough with clear to white sputum, fatigue, generalized body aches, decrease appetite x 2-4 days. Denies CP, chest tightness, SOB, palpitations. Pt A&Ox3, comfortably communicating, no acute visible distress. Pt febrile, SPO2 90% on arrival, pt reports that is his base line saturation.No use of accessory muscles or nasal flaring noted.  Bibasilar wheezing to Auscultation.   CXR recommended , pt refused.  The history is provided by the patient.  Weakness  Primary symptoms include no dizziness. Primary symptoms comment: generalized weakness, decrease apetite . The current episode started more than 2 days ago. The problem has not changed since onset.The maximum temperature recorded prior to his arrival was 101 to 101.9 F. Pertinent negatives include no shortness of breath, no chest pain, no vomiting, no altered mental status, no confusion and no headaches.    Past Medical History:  Diagnosis Date  . Arthritis    "touch; fingers; knees" (11/20/2014)  . CAD (coronary artery disease)    a. 1993 PTCA LCX;  b. 1999 PCI/BMS to RCA and LAD;  c. 11/2014 LM Ca2+, LAD patent mid stent, 20-33m, D1 31m, D2 30, LCX Ca2+, RCA 30-21m, 20d, PDA 30-40, RV 100p/m, EF 55-65%;  d. 11/2014 Echo: EF 55%, Gr 1 DD.  [name] Colon polyps   . COPD (chronic obstructive pulmonary disease) (HCC)    "beginning stages" (11/20/2014)  . Degenerative arthritis of left knee   . Diabetic mononeuropathy (HCC)   . Essential hypertension   . GERD (gastroesophageal reflux disease)   . Hyperlipidemia   . Tobacco abuse   . Type II diabetes mellitus [facility])     Patient Active Problem List   Diagnosis Date Noted  . Angina pectoris (HCC) 12/21/2014  . Claudication  (HCC) 12/21/2014  . CAD (coronary artery disease) 11/22/2014  . NSTEMI (non-ST elevated myocardial infarction) (HCC)   . ST elevation myocardial infarction (STEMI) involving other coronary artery (HCC) 11/20/2014  . DM type 2 (diabetes mellitus, type 2) (HCC) 11/20/2014  . HTN (hypertension) 11/20/2014  . Hyperlipidemia 11/20/2014  . COPD (chronic obstructive pulmonary disease) (HCC) 11/20/2014  . Tobacco abuse 11/20/2014  . STEMI (ST elevation myocardial infarction) (HCC) 11/20/2014    Past Surgical History:  Procedure Laterality Date  . CARDIAC CATHETERIZATION  11/20/2014  . CATARACT EXTRACTION W/ INTRAOCULAR LENS  IMPLANT, BILATERAL Bilateral 1990-1991  . CORONARY ANGIOPLASTY WITH STENT PLACEMENT  1993; 1999   PTCA of the LCx in 1993 and stenting of the RCA and LAD in 1999/notes 11/20/2014  . LEFT HEART CATHETERIZATION WITH CORONARY ANGIOGRAM N/A 11/20/2014   Procedure: LEFT HEART CATHETERIZATION WITH CORONARY ANGIOGRAM;  Surgeon: Peter M [place], MD;  Location: [facility] CATH LAB;  Service: Cardiovascular;  Laterality: N/A;  . TONSILLECTOMY  1950's       Home Medications    Prior to Admission medications   Medication Sig Start Date End Date Taking? Authorizing Provider  albuterol (PROVENTIL HFA;VENTOLIN HFA) 108 (90 Base) MCG/ACT inhaler Inhale 1-2 puffs into the lungs every 6 (six) hours as needed for wheezing or shortness of breath. 11/02/17   Yarixa Lightcap, NP  amLODipine (NORVASC) 10 MG tablet 10 mg daily.     [provider]  aspirin EC 81 MG tablet Take 81 mg by mouth daily.     [provider]  carvedilol (COREG) 25 MG tablet Take 1 tablet (25 mg total) by mouth 2 (two) times daily with a meal. 11/22/14   Creig Hines, NP  Cholecalciferol (VITAMIN D3) 1000 UNITS CAPS Take 1,000 Units by mouth every evening.     [provider]  clopidogrel (PLAVIX) 75 MG tablet Take 1 tablet (75 mg total) by mouth daily. 11/22/14   Creig Hines,  NP  dextromethorphan-guaiFENesin [facility] DM) 30-600 MG 12hr tablet Take 1 tablet by mouth 2 (two) times daily. 11/02/17   Amesha Bailey, NP  hydrALAZINE (APRESOLINE) 25 MG tablet Take 1 tablet (25 mg total) by mouth 2 (two) times daily. 02/24/17   [place], Peter M, MD  isosorbide mononitrate (IMDUR) 60 MG 24 hr tablet Take 1.5 tablets (90 mg total) by mouth daily. 12/21/14   Dwana Melena, PA-C  losartan (COZAAR) 100 MG tablet Take 1 tablet (100 mg total) by mouth daily. 11/22/14   Creig Hines, NP  metFORMIN (GLUCOPHAGE) 1000 MG tablet  09/06/16   [provider]  nitroGLYCERIN (NITROSTAT) 0.4 MG SL tablet Place 0.4 mg under the tongue every 5 (five) minutes as needed for chest pain.     [provider]  pravastatin (PRAVACHOL) 40 MG tablet Take 1 tablet (40 mg total) by mouth daily at 6 PM. 11/22/14   Creig Hines, NP  predniSONE (DELTASONE) 20 MG tablet Take 1 tablet (20 mg total) by mouth 2 (two) times daily with a meal. 11/02/17   Zondra Lawlor, NP  sitaGLIPtin (JANUVIA) 50 MG tablet Take 50 mg by mouth daily.  07/27/14   [provider]    Family History Family History  Problem Relation Age of Onset  . Heart disease Father     Social History Social History   Tobacco Use  . Smoking status: Current Every Day Smoker    Packs/day: 1.50    Years: 50.00    Pack years: 75.00    Types: Cigarettes  . Smokeless tobacco: Never Used  Substance Use Topics  . Alcohol use: Yes    Alcohol/week: 21.0 oz    Types: 35 Cans of beer per week    Comment: 11/20/2014 "I average 5 beers/day"  . Drug use: No     Allergies   Patient has no known allergies.   Review of Systems Review of Systems  Constitutional: Positive for appetite change, fatigue and fever.  Eyes: Negative.   Respiratory: Positive for cough. Negative for shortness of breath.   Cardiovascular: Negative for chest pain, palpitations and leg swelling.  Gastrointestinal:  Negative for vomiting.  Neurological: Positive for weakness. Negative for dizziness, light-headedness and headaches.  Psychiatric/Behavioral: Negative.  Negative for confusion.     Physical Exam Triage Vital Signs ED Triage Vitals  Enc Vitals Group     BP 11/02/17 1226 (!) 157/57     Pulse Rate 11/02/17 1226 73     Resp 11/02/17 1226 20     Temp 11/02/17 1226 (!) 101.3 F (38.5 C)     Temp Source 11/02/17 1226 Oral     SpO2 11/02/17 1226 90 %     Weight 11/02/17 1229 150 lb (68 kg)     Height 11/02/17 1229 5\' 6"  (1.676 m)     Head Circumference --      Peak Flow --      Pain Score 11/02/17 1229 2  Pain Loc --      Pain Edu? --      Excl. in GC? --    No data found.  Updated Vital Signs BP (!) 157/57 (BP Location: Left Arm)   Pulse 73   Temp (!) 101.3 F (38.5 C) (Oral)   Resp 20   Ht 5\' 6"  (1.676 m)   Wt 150 lb (68 kg)   SpO2 90%   BMI 24.21 kg/m   Visual Acuity Right Eye Distance:   Left Eye Distance:   Bilateral Distance:    Right Eye Near:   Left Eye Near:    Bilateral Near:     Physical Exam  Constitutional: He is oriented to person, place, and time. He appears well-developed and well-nourished.  HENT:  Head: Normocephalic.  Eyes: Pupils are equal, round, and reactive to light.  Cardiovascular: Normal rate, regular rhythm and normal heart sounds.  Pulmonary/Chest: Effort normal. No stridor. No respiratory distress. He has wheezes (Bibasilar wheezing). He has no rales. He exhibits no tenderness.  Musculoskeletal: Normal range of motion.  Neurological: He is alert and oriented to person, place, and time.  Skin: Skin is warm.  Psychiatric: He has a normal mood and affect. His behavior is normal. Judgment and thought content normal.     UC Treatments / Results  Labs (all labs ordered are listed, but only abnormal results are displayed) Labs Reviewed - No data to display  EKG  EKG Interpretation None       Radiology No results  found.  Procedures Procedures (including critical care time)  Medications Ordered in UC Medications  acetaminophen (TYLENOL) tablet 650 mg (650 mg Oral Given 11/02/17 1234)  albuterol (PROVENTIL) (2.5 MG/3ML) 0.083% nebulizer solution 2.5 mg (2.5 mg Nebulization Given 11/02/17 1306)     Initial Impression / Assessment and Plan / UC Course  I have reviewed the triage vital signs and the nursing notes.  Pertinent labs & imaging results that were available during my care of the patient were reviewed by me and considered in my medical decision making (see chart for details).    Symptoms highly likely for Influenza. Pt out of 48 hr window to prescribe Tamiflu.  CXR recommended to R/O acute pulmonary disease. Pt refused to do CXR. Advised to return in 3 days if no improvement in Symptoms. Earlier if symptoms worsens. Pt verbalizes understanding. Ran out of Albuterol INH. Refill provided. Encourage to increase rest and hydration. Tylenol/Motrin as needed for pain.fever.   Final Clinical Impressions(s) / UC Diagnoses   Final diagnoses:  Influenza with respiratory manifestation  Cough  Fever, unspecified  Wheezing    ED Discharge Orders        Ordered    albuterol (PROVENTIL HFA;VENTOLIN HFA) 108 (90 Base) MCG/ACT inhaler  Every 6 hours PRN     11/02/17 1308    dextromethorphan-guaiFENesin (MUCINEX DM) 30-600 MG 12hr tablet  2 times daily     11/02/17 1308    predniSONE (DELTASONE) 20 MG tablet  2 times daily with meals     11/02/17 1308       Controlled Substance Prescriptions [place] Controlled Substance Registry consulted? Not Applicable   Reinaldo RaddleMultani, Baily Serpe, NP 11/02/17 1313

## 2017-11-02 NOTE — ED Triage Notes (Signed)
Pt states she started last weekend with fatigue and weakness, decreased appetite, low-grade fever.  Denies runny nose or sore throat. At times feels achy. Mild cough, non-productive.

## 2017-11-02 NOTE — Discharge Instructions (Signed)
Advise to return in 3 days if no improvement in Symptoms. Earlier if symptoms worsens.  Encourage to increase rest and hydration. Tylenol/Motrin as needed for pain.fever. FU PCP x 2 days

## 2017-11-02 NOTE — ED Notes (Signed)
Bhupinder PA-C notified of VS findings and patient presentation

## 2017-12-03 DEATH — deceased

## 2020-04-05 ENCOUNTER — Encounter: Payer: Self-pay | Admitting: General Practice
